# Patient Record
Sex: Female | Born: 1970 | Race: White | Hispanic: No | Marital: Married | Smoking: Never smoker
Health system: Southern US, Community
[De-identification: ages and names within clinical notes are randomized; demographics above are authoritative.]

## PROBLEM LIST (undated history)

## (undated) DIAGNOSIS — G4730 Sleep apnea, unspecified: Secondary | ICD-10-CM

## (undated) DIAGNOSIS — Z806 Family history of leukemia: Secondary | ICD-10-CM

## (undated) DIAGNOSIS — M6282 Rhabdomyolysis: Principal | ICD-10-CM

## (undated) DIAGNOSIS — E079 Disorder of thyroid, unspecified: Secondary | ICD-10-CM

## (undated) DIAGNOSIS — M1990 Unspecified osteoarthritis, unspecified site: Secondary | ICD-10-CM

## (undated) DIAGNOSIS — Z8249 Family history of ischemic heart disease and other diseases of the circulatory system: Secondary | ICD-10-CM

## (undated) DIAGNOSIS — T8859XA Other complications of anesthesia, initial encounter: Secondary | ICD-10-CM

## (undated) DIAGNOSIS — Z803 Family history of malignant neoplasm of breast: Secondary | ICD-10-CM

## (undated) DIAGNOSIS — N950 Postmenopausal bleeding: Secondary | ICD-10-CM

## (undated) DIAGNOSIS — T4145XA Adverse effect of unspecified anesthetic, initial encounter: Secondary | ICD-10-CM

## (undated) DIAGNOSIS — E2740 Unspecified adrenocortical insufficiency: Secondary | ICD-10-CM

## (undated) DIAGNOSIS — D759 Disease of blood and blood-forming organs, unspecified: Secondary | ICD-10-CM

## (undated) DIAGNOSIS — Z8042 Family history of malignant neoplasm of prostate: Secondary | ICD-10-CM

## (undated) HISTORY — DX: Family history of leukemia: Z80.6

## (undated) HISTORY — DX: Family history of malignant neoplasm of prostate: Z80.42

## (undated) HISTORY — PX: OTHER SURGICAL HISTORY: SHX169

## (undated) HISTORY — DX: Family history of malignant neoplasm of breast: Z80.3

---

## 2005-07-06 ENCOUNTER — Emergency Department (HOSPITAL_COMMUNITY): Admission: EM | Admit: 2005-07-06 | Discharge: 2005-07-06 | Payer: Self-pay | Admitting: Emergency Medicine

## 2005-07-16 ENCOUNTER — Encounter: Admission: RE | Admit: 2005-07-16 | Discharge: 2005-10-14 | Payer: Self-pay | Admitting: Orthopedic Surgery

## 2006-04-17 ENCOUNTER — Inpatient Hospital Stay (HOSPITAL_COMMUNITY): Admission: AD | Admit: 2006-04-17 | Discharge: 2006-04-18 | Payer: Self-pay | Admitting: Obstetrics and Gynecology

## 2006-06-02 ENCOUNTER — Inpatient Hospital Stay (HOSPITAL_COMMUNITY): Admission: AD | Admit: 2006-06-02 | Discharge: 2006-06-04 | Payer: Self-pay | Admitting: Obstetrics & Gynecology

## 2008-03-10 IMAGING — US US OB COMP +14 WK
1 series · 13 of 28 positions shown · non-contrast
Comparison: none

CLINICAL DATA: 35-year-old with 32 week gestation.  Contractions.  Pelvic pain.  Preterm labor.

[Series 1: us ob comp +14 wk · 0.35mm/px · 13 of 33 slices shown]
[im 2/33]
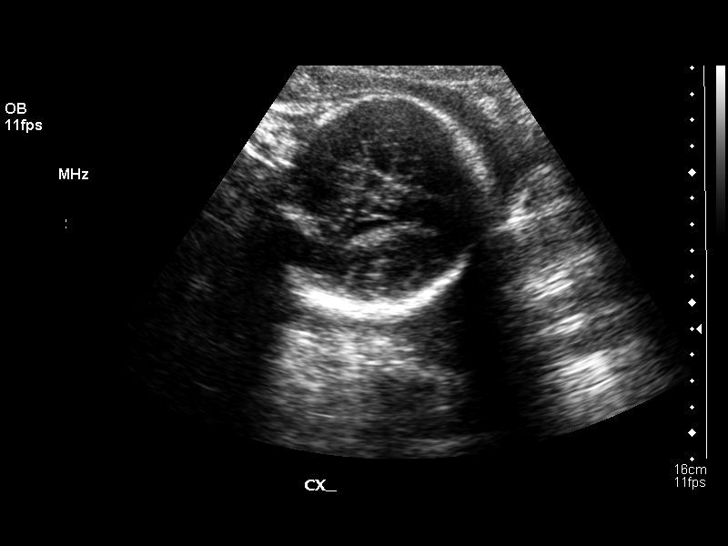
[im 4/33]
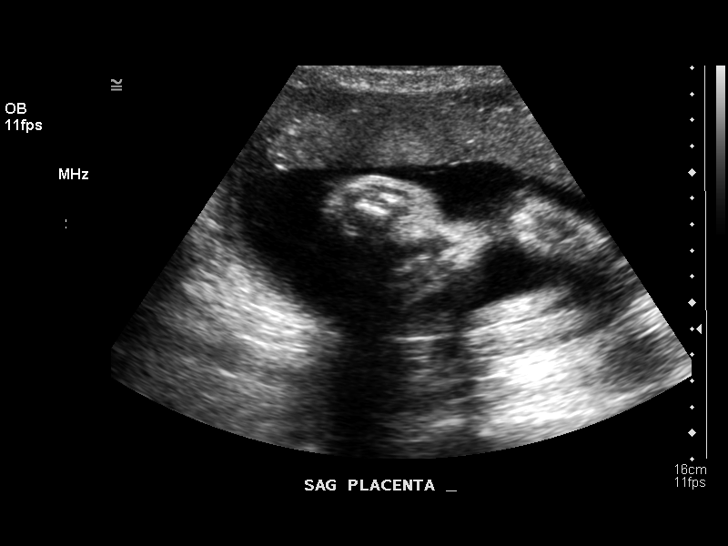
[im 6/33]
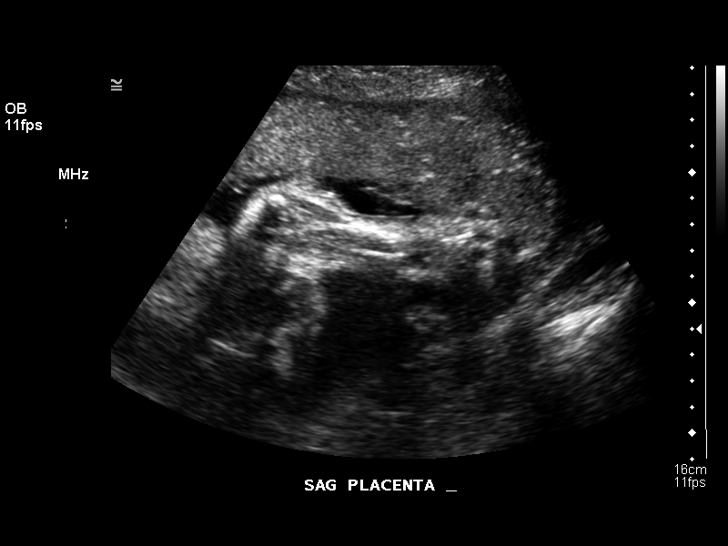
[im 9/33]
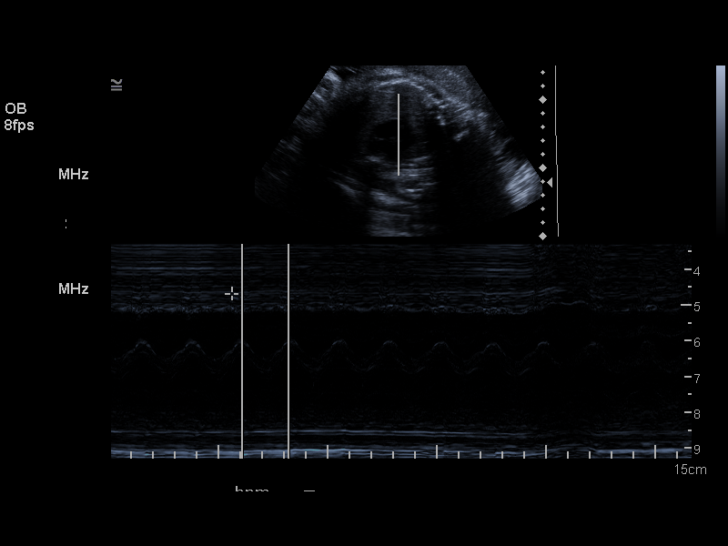
[im 11/33]
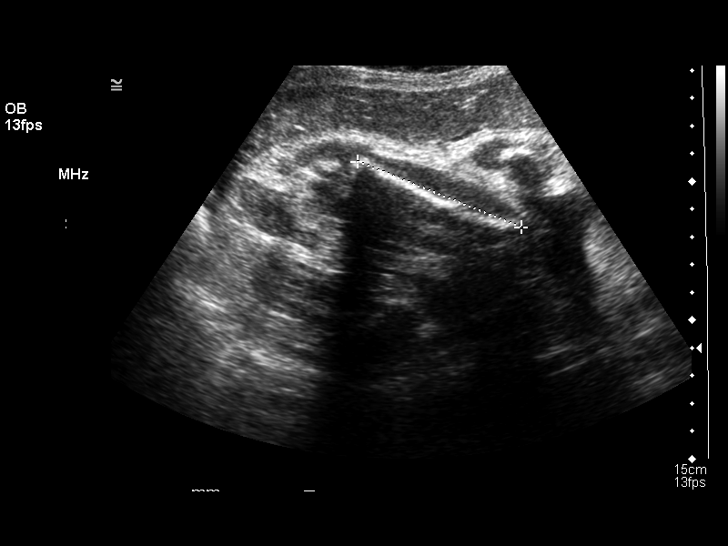
[im 14/33]
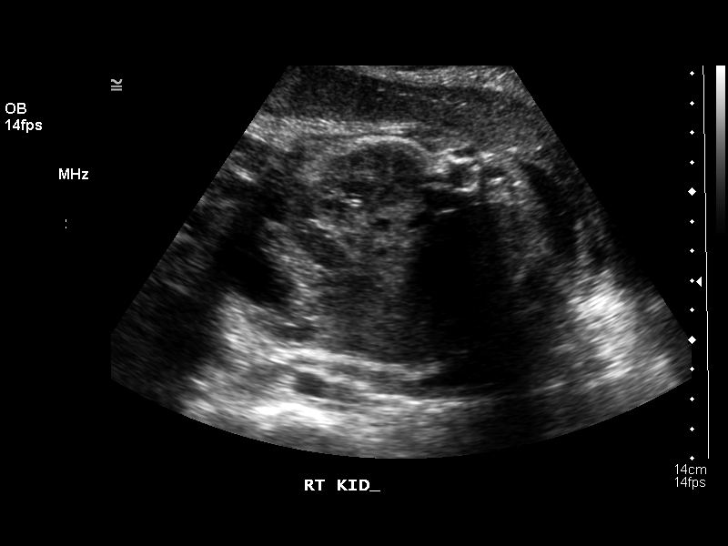
[im 17/33]
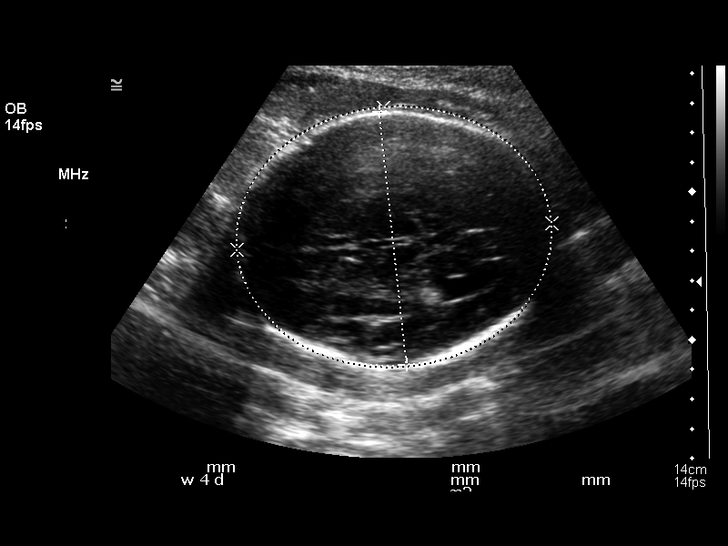
[im 19/33]
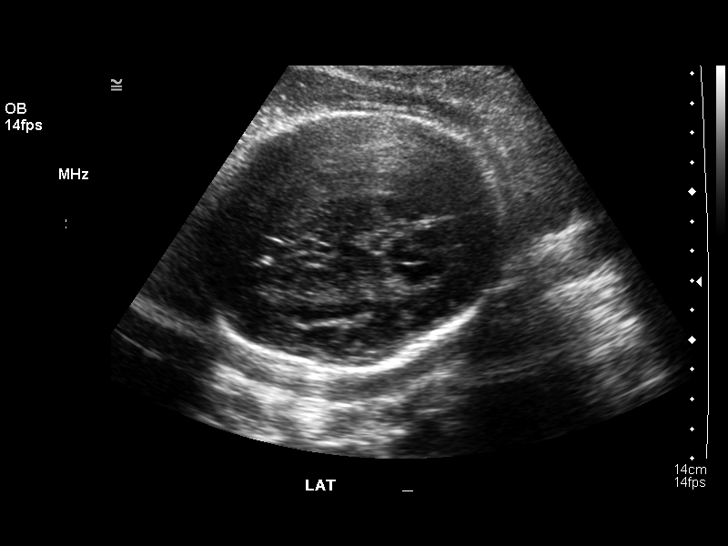
[im 22/33]
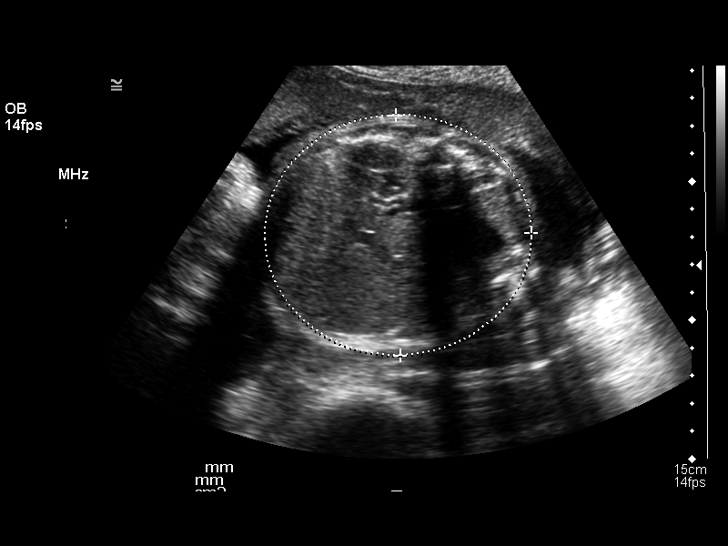
[im 24/33]
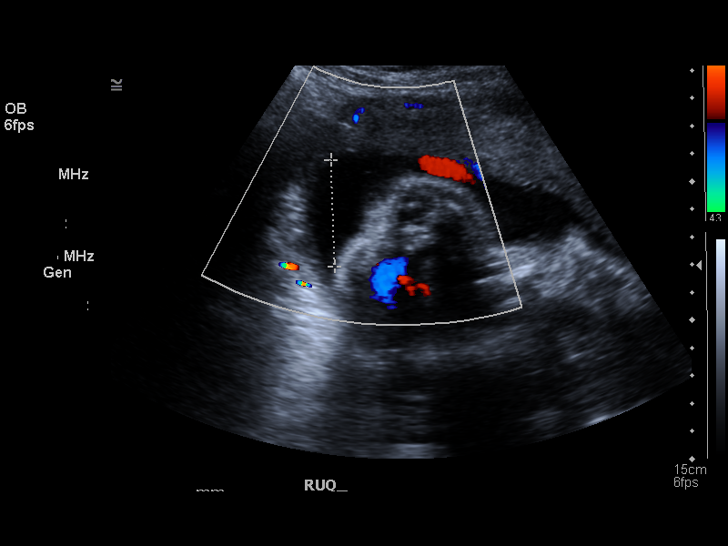
[im 27/33]
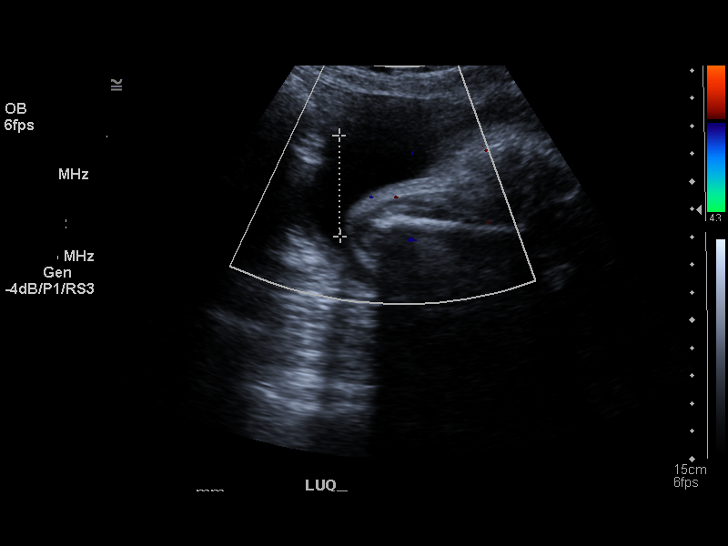
[im 29/33]
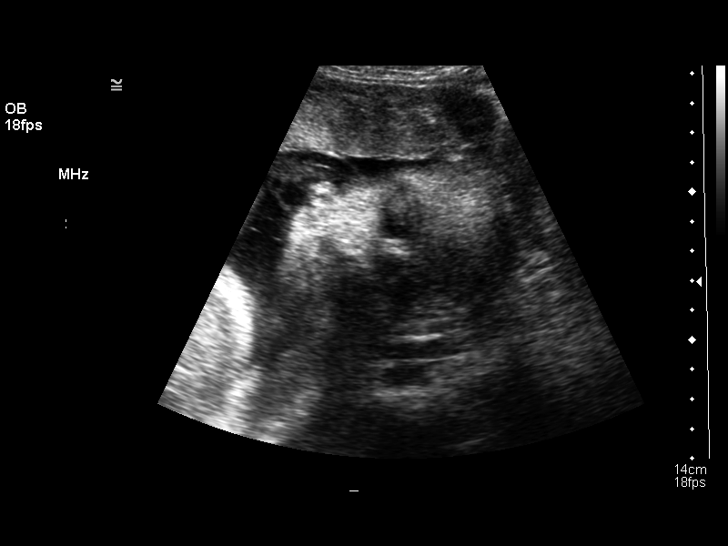
[im 31/33]
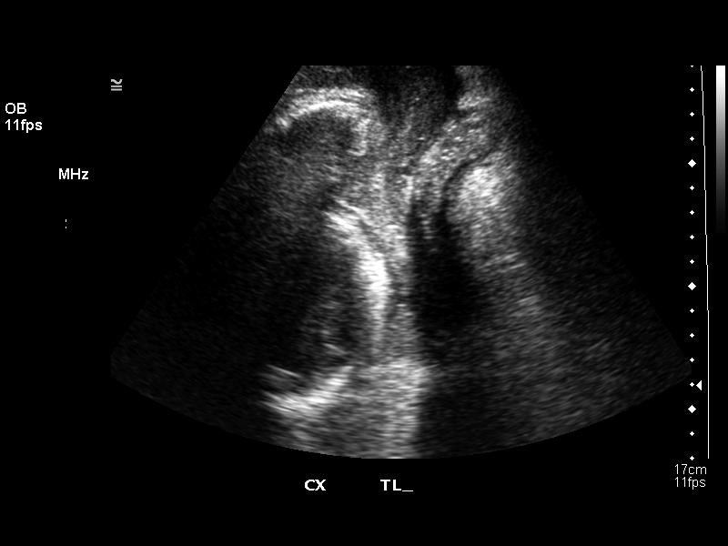

[13 of 28 positions shown; findings below may reference images not displayed]

OBSTETRICAL ULTRASOUND:
 Number of Fetuses: 1
 Heart Rate: 141
 Movement:  Yes
 Breathing:  Yes  
 Presentation:  Cephalic
 Placental Location:  Anterior
 Grade:  I
 Previa:  No
 Amniotic Fluid (Subjective):  Normal
 Amniotic Fluid (Objective): 14.7 cm AFI (5th -95th%ile = 8.3 ? 24.5 cm for 33 wks)

 FETAL BIOMETRY
 BPD:   8.5 cm   34 w 2 d
 HC:   30.4 cm  33 w 6 d
 AC:   28.6 cm  32 w 4 d
 FL:    6.4 cm  30 w 0 d

 MEAN GA:  33 w 3 d  US EDC:  06/02/06

 EFW:  4151 g (H) 50th ? 75th%ile (7978 ? 2775 g) For 33 wks

 FETAL ANATOMY
 Lateral Ventricles:    Visualized   
 Thalami/CSP:      Visualized 
 Posterior Fossa:  Not visualized 
 Nuchal Region:    Not visualized 
 Spine:      Not visualized 
 4 Chamber Heart on Left:      Not visualized 
 Stomach on Left:      Visualized 
 3 Vessel Cord:    Visualized 
 Cord Insertion site:    Not visualized 
 Kidneys:  Visualized 
 Bladder:  Visualized 
 Extremities:      Not visualized 

 ADDITIONAL ANATOMY VISUALIZED:  Upper lip and female genitalia.

 MATERNAL UTERINE AND ADNEXAL FINDINGS
 Cervix: 3.3 cm transabdominally
IMPRESSION: 1.  Single living intrauterine gestation in cephalic presentation with assigned gestational age of 32 weeks 4 days.  Estimated gestational age by this ultrasound is 33 weeks 3 days.  
 2.  Cervical length 3.3 cm.
 3.  Limited fetal anatomy due to advanced gestational age.

## 2008-12-13 ENCOUNTER — Encounter: Admission: RE | Admit: 2008-12-13 | Discharge: 2008-12-13 | Payer: Self-pay | Admitting: Obstetrics

## 2009-12-30 ENCOUNTER — Emergency Department (HOSPITAL_BASED_OUTPATIENT_CLINIC_OR_DEPARTMENT_OTHER): Admission: EM | Admit: 2009-12-30 | Discharge: 2009-12-30 | Payer: Self-pay | Admitting: Emergency Medicine

## 2010-12-12 LAB — RAPID STREP SCREEN (MED CTR MEBANE ONLY): Streptococcus, Group A Screen (Direct): POSITIVE — AB

## 2011-01-30 ENCOUNTER — Emergency Department (HOSPITAL_BASED_OUTPATIENT_CLINIC_OR_DEPARTMENT_OTHER)
Admission: EM | Admit: 2011-01-30 | Discharge: 2011-01-30 | Disposition: A | Discharge status: Home / Self Care | Payer: BC Managed Care – PPO | Attending: Emergency Medicine | Admitting: Emergency Medicine

## 2011-01-30 DIAGNOSIS — S30860A Insect bite (nonvenomous) of lower back and pelvis, initial encounter: Secondary | ICD-10-CM | POA: Insufficient documentation

## 2011-01-30 DIAGNOSIS — W57XXXA Bitten or stung by nonvenomous insect and other nonvenomous arthropods, initial encounter: Secondary | ICD-10-CM | POA: Insufficient documentation

## 2011-02-08 NOTE — Discharge Summary (Signed)
NAMEANARELY, NICHOLLS              ACCOUNT NO.:  [id number]   MEDICAL RECORD NO.:  [id number]          PATIENT TYPE:  INP   LOCATION:  9159                          FACILITY:  [facility]   PHYSICIAN:  Richardean Sale, M.D.   DATE OF BIRTH:  09-22-1971   DATE OF ADMISSION:  04/17/2006  DATE OF DISCHARGE:  04/18/2006                                 DISCHARGE SUMMARY   ADMITTING DIAGNOSIS:  32 week intrauterine pregnancy with preterm  contractions.   DISCHARGE DIAGNOSES:  32 week intrauterine pregnancy with preterm  contractions.   HOSPITAL COURSE/HISTORY OF PRESENT ILLNESS:  Please see admission history  and physical for details.  Briefly, this is a 40 year old gravida 2, para 1  white female who is at 62 and half weeks who presented to the office  complaining of some lower abdominal cramping.  The patient was found her on  cervical exam her cervix was dilated to 1 cm, 60% effaced, -2 station  vertex.  She was subsequently admitted for observation for rule out preterm  labor.  She received betamethasone x2 doses, started on Procardia for  preterm contractions and started on ampicillin while a group B strep culture  was pending.  The patient's cervix remained unchanged during her  hospitalization.  She underwent a fetal fibronectin on 04/18/2006 which was  negative and she was subsequently discharged to home with preterm labor  precautions.   DISPOSITION:  To home.  Condition stable.   FOLLOW UP:  The patient will follow up within one week in the office for  return OB visit.  She was given instructions to call for contractions  greater than six an hour, for any vaginal bleeding, loss of fluid, decreased  fetal movement.   LABORATORY STUDIES:  Hemoglobin 12.7, hematocrit 37.1, platelets 152.  Fetal  fibronectin negative.  Urinalysis negative.  Group B strep negative, RPR  nonreactive.      Richardean Sale, M.D.  Electronically Signed     JW/MEDQ  D:  07/07/2006  T:  07/08/2006   Job:  [id number]

## 2011-09-25 ENCOUNTER — Ambulatory Visit: Payer: BC Managed Care – PPO | Attending: Orthopedic Surgery | Admitting: Physical Therapy

## 2011-09-25 DIAGNOSIS — M25519 Pain in unspecified shoulder: Secondary | ICD-10-CM | POA: Insufficient documentation

## 2011-09-25 DIAGNOSIS — M25619 Stiffness of unspecified shoulder, not elsewhere classified: Secondary | ICD-10-CM | POA: Insufficient documentation

## 2011-10-01 ENCOUNTER — Ambulatory Visit: Payer: BC Managed Care – PPO | Admitting: Physical Therapy

## 2011-10-03 ENCOUNTER — Ambulatory Visit: Payer: BC Managed Care – PPO | Admitting: Physical Therapy

## 2011-10-08 ENCOUNTER — Ambulatory Visit: Payer: BC Managed Care – PPO | Admitting: Physical Therapy

## 2011-10-10 ENCOUNTER — Ambulatory Visit: Payer: BC Managed Care – PPO | Admitting: Physical Therapy

## 2013-06-20 ENCOUNTER — Emergency Department (HOSPITAL_BASED_OUTPATIENT_CLINIC_OR_DEPARTMENT_OTHER)
Admission: EM | Admit: 2013-06-20 | Discharge: 2013-06-20 | Disposition: A | Discharge status: Home / Self Care | Payer: BC Managed Care – PPO | Attending: Emergency Medicine | Admitting: Emergency Medicine

## 2013-06-20 ENCOUNTER — Encounter (HOSPITAL_BASED_OUTPATIENT_CLINIC_OR_DEPARTMENT_OTHER): Payer: Self-pay | Admitting: Emergency Medicine

## 2013-06-20 DIAGNOSIS — M79609 Pain in unspecified limb: Secondary | ICD-10-CM | POA: Insufficient documentation

## 2013-06-20 DIAGNOSIS — M6282 Rhabdomyolysis: Secondary | ICD-10-CM | POA: Insufficient documentation

## 2013-06-20 DIAGNOSIS — M79602 Pain in left arm: Secondary | ICD-10-CM

## 2013-06-20 LAB — CBC WITH DIFFERENTIAL/PLATELET
Basophils Absolute: 0 10*3/uL (ref 0.0–0.1)
Basophils Relative: 0 % (ref 0–1)
Eosinophils Relative: 3 % (ref 0–5)
HCT: 39.9 % (ref 36.0–46.0)
Hemoglobin: 13.9 g/dL (ref 12.0–15.0)
Lymphs Abs: 1.8 10*3/uL (ref 0.7–4.0)
MCHC: 34.8 g/dL (ref 30.0–36.0)
MCV: 93 fL (ref 78.0–100.0)
Monocytes Absolute: 0.7 10*3/uL (ref 0.1–1.0)
Neutro Abs: 4.1 10*3/uL (ref 1.7–7.7)
RBC: 4.29 MIL/uL (ref 3.87–5.11)
RDW: 12.1 % (ref 11.5–15.5)
WBC: 6.8 10*3/uL (ref 4.0–10.5)

## 2013-06-20 LAB — URINALYSIS, ROUTINE W REFLEX MICROSCOPIC
Glucose, UA: NEGATIVE mg/dL
Ketones, ur: NEGATIVE mg/dL

## 2013-06-20 LAB — BASIC METABOLIC PANEL
Calcium: 9.5 mg/dL (ref 8.4–10.5)
GFR calc non Af Amer: 69 mL/min — ABNORMAL LOW (ref >90)

## 2013-06-20 LAB — CK: Total CK: 2493 U/L — ABNORMAL HIGH (ref 7–177)

## 2013-06-20 LAB — URINE MICROSCOPIC-ADD ON

## 2013-06-20 MED ORDER — SODIUM CHLORIDE 0.9 % IV BOLUS (SEPSIS)
1000.0000 mL | Freq: Once | INTRAVENOUS | Status: AC
  Administered 2013-06-20: 1000 mL via INTRAVENOUS

## 2013-06-20 NOTE — ED Notes (Signed)
MD at bedside for disposition 

## 2013-06-20 NOTE — ED Provider Notes (Signed)
CSN: [id number]     Arrival date & time 06/20/13  0645 History   None    Chief Complaint  Patient presents with  . Arm Pain   (Consider location/radiation/quality/duration/timing/severity/associated sxs/prior Treatment) HPI Comments: Patient is a 42 year old female with no significant past medical history presents with complaints of left arm pain. She states that she was competing in a cross fit competition yesterday. When she was finished she developed significant soreness and swelling of the left tricep. She is concerned that she may be in rhabdomyolysis as others that have been training with her have had similar experiences. She denies any dark urine. She denies any other complaints.  Patient is a 42 y.o. female presenting with arm pain. The history is provided by the patient.  Arm Pain This is a new problem. The current episode started yesterday. The problem occurs constantly. The problem has been gradually worsening. Pertinent negatives include no chest pain and no shortness of breath. Exacerbated by: Movement and palpation. Nothing relieves the symptoms. She has tried nothing for the symptoms. The treatment provided no relief.    History reviewed. No pertinent past medical history. History reviewed. No pertinent past surgical history. History reviewed. No pertinent family history. History  Substance Use Topics  . Smoking status: Never Smoker   . Smokeless tobacco: Not on file  . Alcohol Use: Yes     Comment: social   OB History   Grav Para Term Preterm Abortions TAB SAB Ect Mult Living                 Review of Systems  Respiratory: Negative for shortness of breath.   Cardiovascular: Negative for chest pain.  All other systems reviewed and are negative.    Allergies  Review of patient's allergies indicates no known allergies.  Home Medications  No current outpatient prescriptions on file. BP 116/72  Pulse 78  Temp(Src) 97.8 F (36.6 C) (Oral)  Resp 16  Ht 5\' 8"   (1.727 m)  Wt 155 lb (70.308 kg)  BMI 23.57 kg/m2  SpO2 99%  LMP 05/27/2013 Physical Exam  Nursing note and vitals reviewed. Constitutional: She is oriented to person, place, and time. She appears well-developed and well-nourished. No distress.  HENT:  Head: Normocephalic and atraumatic.  Neck: Normal range of motion. Neck supple.  Cardiovascular: Normal rate and regular rhythm.  Exam reveals no gallop and no friction rub.   No murmur heard. Pulmonary/Chest: Effort normal and breath sounds normal. No respiratory distress. She has no wheezes.  Abdominal: Soft. Bowel sounds are normal. She exhibits no distension. There is no tenderness.  Musculoskeletal: Normal range of motion.  There is swelling and tenderness to palpation of the left tricep. There is no redness or inflammation. There is pain with range of motion. Distal ulnar and radial pulses are easily palpable.  Neurological: She is alert and oriented to person, place, and time.  Skin: Skin is warm and dry. She is not diaphoretic.    ED Course  Procedures (including critical care time) Labs Review Labs Reviewed  CBC WITH DIFFERENTIAL  BASIC METABOLIC PANEL  CK  URINALYSIS, ROUTINE W REFLEX MICROSCOPIC   Imaging Review No results found.  MDM  No diagnosis found. Patient presents here with complaints of arm pain and is concerned about rhabdomyolysis. Workup reveals a total CK of 2500. She was given 1 L of normal saline and the remainder of her laboratory studies are unremarkable. I discussed the case with Dr. Arlean Hopping from nephrology who did  not feel as though this level of CK put the patient in jeopardy of developing renal failure. His recommendations were increased fluids for the next several days and refrain from workouts for several days as well. She will be discharged home with these instructions and is to return if she develops new or other some symptoms.    Geoffery Lyons, MD 06/20/13 [phone]

## 2013-06-20 NOTE — ED Notes (Addendum)
Pt presents to Ed for evaluation of left arm pain and swelling. Pt reports competing in a cross fit competition yesterday, pt reports the pain and swelling began that night and is worried that it may be rhabdomyolysis. Pt reports 8/10 arm pain with movement and also nausea that started as well. Pt is alert and oriented. NAD noted at this time

## 2013-06-20 NOTE — ED Notes (Signed)
MD at bedside. 

## 2013-06-21 DIAGNOSIS — M6282 Rhabdomyolysis: Principal | ICD-10-CM

## 2013-06-21 HISTORY — DX: Rhabdomyolysis: M62.82

## 2013-06-22 ENCOUNTER — Encounter (HOSPITAL_BASED_OUTPATIENT_CLINIC_OR_DEPARTMENT_OTHER): Payer: Self-pay | Admitting: *Deleted

## 2013-06-22 ENCOUNTER — Inpatient Hospital Stay (HOSPITAL_BASED_OUTPATIENT_CLINIC_OR_DEPARTMENT_OTHER)
Admission: EM | Admit: 2013-06-22 | Discharge: 2013-06-25 | DRG: 560 | Disposition: A | Discharge status: Home / Self Care | Payer: BC Managed Care – PPO | Attending: Internal Medicine | Admitting: Internal Medicine

## 2013-06-22 DIAGNOSIS — R112 Nausea with vomiting, unspecified: Secondary | ICD-10-CM | POA: Diagnosis present

## 2013-06-22 DIAGNOSIS — E079 Disorder of thyroid, unspecified: Secondary | ICD-10-CM | POA: Diagnosis present

## 2013-06-22 DIAGNOSIS — N179 Acute kidney failure, unspecified: Secondary | ICD-10-CM

## 2013-06-22 DIAGNOSIS — R7989 Other specified abnormal findings of blood chemistry: Secondary | ICD-10-CM | POA: Diagnosis present

## 2013-06-22 DIAGNOSIS — M6282 Rhabdomyolysis: Principal | ICD-10-CM

## 2013-06-22 HISTORY — DX: Other complications of anesthesia, initial encounter: T88.59XA

## 2013-06-22 HISTORY — DX: Rhabdomyolysis: M62.82

## 2013-06-22 HISTORY — DX: Unspecified adrenocortical insufficiency: E27.40

## 2013-06-22 HISTORY — DX: Adverse effect of unspecified anesthetic, initial encounter: T41.45XA

## 2013-06-22 HISTORY — DX: Unspecified osteoarthritis, unspecified site: M19.90

## 2013-06-22 HISTORY — DX: Disorder of thyroid, unspecified: E07.9

## 2013-06-22 LAB — PHOSPHORUS
Phosphorus: 4.1 mg/dL (ref 2.3–4.6)
Phosphorus: 2.9 mg/dL (ref 2.3–4.6)

## 2013-06-22 LAB — BASIC METABOLIC PANEL
CO2: 27 mEq/L (ref 19–32)
Calcium: 9.4 mg/dL (ref 8.4–10.5)
GFR calc non Af Amer: 61 mL/min — ABNORMAL LOW (ref >90)
Glucose, Bld: 71 mg/dL (ref 70–99)
Sodium: 138 mEq/L (ref 135–145)

## 2013-06-22 LAB — CBC WITH DIFFERENTIAL/PLATELET
Basophils Relative: 0 % (ref 0–1)
Eosinophils Relative: 2 % (ref 0–5)
HCT: 37.4 % (ref 36.0–46.0)
Lymphocytes Relative: 26 % (ref 12–46)
Lymphs Abs: 1.7 10*3/uL (ref 0.7–4.0)
MCV: 92.8 fL (ref 78.0–100.0)
Monocytes Absolute: 0.5 10*3/uL (ref 0.1–1.0)
Neutro Abs: 4.3 10*3/uL (ref 1.7–7.7)
RBC: 4.03 MIL/uL (ref 3.87–5.11)
WBC: 6.6 10*3/uL (ref 4.0–10.5)

## 2013-06-22 LAB — COMPREHENSIVE METABOLIC PANEL
ALT: 59 U/L — ABNORMAL HIGH (ref 0–35)
AST: 160 U/L — ABNORMAL HIGH (ref 0–37)
Albumin: 3.1 g/dL — ABNORMAL LOW (ref 3.5–5.2)
Alkaline Phosphatase: 31 U/L — ABNORMAL LOW (ref 39–117)
Calcium: 8.4 mg/dL (ref 8.4–10.5)
Chloride: 105 mEq/L (ref 96–112)
GFR calc non Af Amer: 48 mL/min — ABNORMAL LOW (ref >90)
Glucose, Bld: 97 mg/dL (ref 70–99)
Potassium: 4.1 mEq/L (ref 3.5–5.1)
Sodium: 139 mEq/L (ref 135–145)
Total Protein: 6 g/dL (ref 6.0–8.3)

## 2013-06-22 LAB — URINE MICROSCOPIC-ADD ON

## 2013-06-22 LAB — URINALYSIS, ROUTINE W REFLEX MICROSCOPIC
Bilirubin Urine: NEGATIVE
Leukocytes, UA: NEGATIVE
Nitrite: NEGATIVE
Protein, ur: NEGATIVE mg/dL
Specific Gravity, Urine: 1.012 (ref 1.005–1.030)

## 2013-06-22 LAB — CK TOTAL AND CKMB (NOT AT ARMC)
CK, MB: 7.1 ng/mL (ref 0.3–4.0)
CK, MB: 9.5 ng/mL (ref 0.3–4.0)
Relative Index: 0.1 (ref 0.0–2.5)
Relative Index: 0.1 (ref 0.0–2.5)
Relative Index: 0.1 (ref 0.0–2.5)
Total CK: 9289 U/L — ABNORMAL HIGH (ref 7–177)

## 2013-06-22 LAB — POTASSIUM
Potassium: 3.7 mEq/L (ref 3.5–5.1)
Potassium: 3.6 mEq/L (ref 3.5–5.1)

## 2013-06-22 LAB — CREATININE, SERUM: GFR calc non Af Amer: 66 mL/min — ABNORMAL LOW (ref >90)

## 2013-06-22 MED ORDER — OXYCODONE HCL 5 MG PO TABS
5.0000 mg | ORAL_TABLET | ORAL | Status: DC | PRN
  Administered 2013-06-22 – 2013-06-23 (×3): 5 mg via ORAL
  Filled 2013-06-22 (×3): qty 1

## 2013-06-22 MED ORDER — ONDANSETRON HCL 4 MG/2ML IJ SOLN
4.0000 mg | Freq: Four times a day (QID) | INTRAMUSCULAR | Status: DC | PRN
  Administered 2013-06-22 – 2013-06-23 (×2): 4 mg via INTRAVENOUS
  Filled 2013-06-22 (×2): qty 2

## 2013-06-22 MED ORDER — SODIUM CHLORIDE 0.9 % IV SOLN
INTRAVENOUS | Status: DC
  Administered 2013-06-22 (×2): via INTRAVENOUS

## 2013-06-22 MED ORDER — SODIUM CHLORIDE 0.45 % IV SOLN: INTRAVENOUS | Status: AC

## 2013-06-22 MED ORDER — ZOLPIDEM TARTRATE 5 MG PO TABS
5.0000 mg | ORAL_TABLET | Freq: Every evening | ORAL | Status: DC | PRN
  Administered 2013-06-23 – 2013-06-24 (×2): 5 mg via ORAL
  Filled 2013-06-22 (×2): qty 1

## 2013-06-22 MED ORDER — SODIUM BICARBONATE 8.4 % IV SOLN
INTRAVENOUS | Status: AC
  Administered 2013-06-22: 50 meq via INTRAVENOUS
  Filled 2013-06-22: qty 50

## 2013-06-22 MED ORDER — SODIUM CHLORIDE 0.9 % IV BOLUS (SEPSIS)
1000.0000 mL | Freq: Once | INTRAVENOUS | Status: AC
  Administered 2013-06-22: 18:00:00 1000 mL via INTRAVENOUS

## 2013-06-22 MED ORDER — ASPIRIN EC 81 MG PO TBEC
81.0000 mg | DELAYED_RELEASE_TABLET | Freq: Every day | ORAL | Status: DC
  Administered 2013-06-22 – 2013-06-23 (×2): 81 mg via ORAL
  Filled 2013-06-22 (×4): qty 1

## 2013-06-22 MED ORDER — SENNA 8.6 MG PO TABS
1.0000 | ORAL_TABLET | Freq: Every day | ORAL | Status: DC | PRN
  Filled 2013-06-22: qty 1

## 2013-06-22 MED ORDER — SODIUM CHLORIDE 0.9 % IV SOLN
Freq: Once | INTRAVENOUS | Status: AC
  Administered 2013-06-22: 06:00:00 via INTRAVENOUS

## 2013-06-22 MED ORDER — MORPHINE SULFATE 2 MG/ML IJ SOLN: 1.0000 mg | INTRAMUSCULAR | Status: DC | PRN

## 2013-06-22 MED ORDER — SODIUM BICARBONATE 8.4 % IV SOLN
50.0000 meq | Freq: Once | INTRAVENOUS | Status: AC
  Administered 2013-06-22: 50 meq via INTRAVENOUS

## 2013-06-22 MED ORDER — SODIUM CHLORIDE 0.9 % IV SOLN
INTRAVENOUS | Status: DC
  Administered 2013-06-22 – 2013-06-24 (×15): via INTRAVENOUS
  Administered 2013-06-24 (×2): 1000 mL via INTRAVENOUS

## 2013-06-22 MED ORDER — ENOXAPARIN SODIUM 40 MG/0.4ML ~~LOC~~ SOLN
40.0000 mg | SUBCUTANEOUS | Status: DC
  Administered 2013-06-22: 40 mg via SUBCUTANEOUS
  Filled 2013-06-22 (×4): qty 0.4

## 2013-06-22 MED ORDER — DOCUSATE SODIUM 100 MG PO CAPS
100.0000 mg | ORAL_CAPSULE | Freq: Two times a day (BID) | ORAL | Status: DC | PRN
  Administered 2013-06-22: 100 mg via ORAL
  Filled 2013-06-22: qty 1

## 2013-06-22 MED ORDER — ONDANSETRON HCL 4 MG PO TABS: 4.0000 mg | ORAL_TABLET | Freq: Four times a day (QID) | ORAL | Status: DC | PRN

## 2013-06-22 NOTE — Progress Notes (Signed)
Utilization review completed. Calem Cocozza RN CCM Case Mgmt  

## 2013-06-22 NOTE — Progress Notes (Signed)
PENDING ACCEPTANCE TRANFER NOTE:  Call received from: Dr Judd Lien  REASON FOR REQUESTING TRANSFER: to treat for rhabdo.  HPI: 42 yo doing crossfit, returned to [facility] with swollen arms and CPK rised from 2500 to 7500 now with Cr of 1.1. She was seen on Sunday with one arm swelling, and now has both with rising CPK.  Dr Judd Lien was concerned because of the increasing in CPK, despite relatively low in total CPK.  He ponders about bringing her in the hospital for IVF and to follow CPK level making sure it is not continuing to rise.   PLAN:  According to telephone report, this patient was accepted for transfer to medical bed,   Under [facility] team: 10,  I have requested an order be written to call Flow Manager at [phone] upon patient arrival to the floor for final physician assignment who will do the admission and give admitting orders.  SIGNEDHouston Siren, MD [facility]  06/22/2013, 6:46 AM

## 2013-06-22 NOTE — ED Provider Notes (Signed)
CSN: [id number]     Arrival date & time 06/22/13  0434 History   First MD Initiated Contact with Patient 06/22/13 0449     No chief complaint on file.  (Consider location/radiation/quality/duration/timing/severity/associated sxs/prior Treatment) HPI Comments: Patient is a 42 year old female presents with complaints of right arm swelling. She competed in a cross fit competition on Saturday. She was seen here on Sunday and had elevated CK level. She was hydrated and discharged to home. Since she's been home she is now noticing swelling and discomfort in the left arm similar to what she had with the right. She denies any dark urine. She denies any fevers or chills. She denies any new injury or trauma. She reports that she has been following her discharge instructions and increase her fluid intake.  Patient is a 42 y.o. female presenting with arm injury. The history is provided by the patient.  Arm Injury Upper extremity pain location: Tricep. Time since incident:  3 days Injury: yes   Mechanism of injury comment:  Cross fit competition Pain details:    Quality:  Aching (Swelling)   Radiates to:  Does not radiate   Severity:  Moderate   Onset quality:  Gradual   Duration:  3 days   Timing:  Constant Chronicity:  New Dislocation: no     History reviewed. No pertinent past medical history. History reviewed. No pertinent past surgical history. History reviewed. No pertinent family history. History  Substance Use Topics  . Smoking status: Never Smoker   . Smokeless tobacco: Not on file  . Alcohol Use: Yes     Comment: social   OB History   Grav Para Term Preterm Abortions TAB SAB Ect Mult Living                 Review of Systems  All other systems reviewed and are negative.    Allergies  Review of patient's allergies indicates no known allergies.  Home Medications  No current outpatient prescriptions on file. BP 111/68  Pulse 60  Temp(Src) 98.3 F (36.8 C) (Oral)  Resp  16  Ht 5\' 8"  (1.727 m)  Wt 155 lb (70.308 kg)  BMI 23.57 kg/m2  SpO2 100%  LMP 05/27/2013 Physical Exam  Nursing note and vitals reviewed. Constitutional: She is oriented to person, place, and time. She appears well-developed and well-nourished. No distress.  HENT:  Head: Normocephalic and atraumatic.  Mouth/Throat: Oropharynx is clear and moist.  Neck: Normal range of motion. Neck supple.  Musculoskeletal: Normal range of motion. She exhibits no edema.  There is swelling and tenderness to palpation of the right upper arm especially in the area of the triceps. There is pain in the muscles when moving the elbow. Distal ulnar and radial pulses are easily palpable and motor and sensory are intact to the bilateral hands.  Neurological: She is alert and oriented to person, place, and time.  Skin: Skin is warm and dry. She is not diaphoretic.    ED Course  Procedures (including critical care time) Labs Review Labs Reviewed  CK  BASIC METABOLIC PANEL  URINALYSIS, ROUTINE W REFLEX MICROSCOPIC   Imaging Review No results found.  MDM  No diagnosis found. Patient was seen here 2 days ago by myself for swelling in the left arm. This was determined to be mild rhabdomyolysis with a CK of 2500.  I consulted with Dr. Arta Silence from nephrology who did not feel as though this level did not pose the danger of renal failure. She  was given normal saline and told to increase her fluids and discharged to home.  She returns today with the same condition in her right arm. Repeat CK level today is now over 7500. I have discussed the case with Dr. Conley Rolls from internal medicine who agrees to accept patient in transfer. She will be started on half normal saline with an amp of bicarbonate and admitted at [facility] for observation. The ulnar and radial pulses in both upper extremities are easily palpable and there is no numbness or tingling. I have little suspicion for compartment syndrome at this point, however I feel as  though observation is indicated in case her condition progresses to this point.    Geoffery Lyons, MD 06/22/13 [phone]

## 2013-06-22 NOTE — ED Notes (Signed)
MD at bedside. 

## 2013-06-22 NOTE — H&P (Addendum)
[facility] History and Physical  Carol Rodriguez WUJ:[id number] DOB: [birth year] DOA: 06/22/2013  Referring physician: PCP: Allean Found, MD  Specialists:   Chief Complaint: Bilateral Bicep pain  HPI: Carol Rodriguez 42 y.o. yo  WF PMHx  none . 43 yo doing crossfit, presented to [facility] with swollen arms and CPK rised from 2500 to 7500 now with Cr of 1.1.  She was seen on Sunday with one arm swelling, and now has both with rising CPK. Dr Judd Lien was concerned because of the increasing in CPK, despite relatively low in total CPK. He ponders about bringing her in the hospital for IVF and to follow CPK level making sure it is not continuing to rise. Patient states that she drinks protein shakes after every workout session (3 time per day), also takes multiple supplements to aid in her crossfit workouts.   Review of Systems: The patient denies anorexia, fever, weight loss,, vision loss, decreased hearing, hoarseness, chest pain, syncope, dyspnea on exertion, peripheral edema, balance deficits, hemoptysis, abdominal pain, melena, hematochezia, severe indigestion/heartburn, hematuria, incontinence, genital sores, muscle weakness, suspicious skin lesions, transient blindness, difficulty walking, depression, unusual weight change, abnormal bleeding, enlarged lymph nodes, angioedema, and breast masses.   Procedure    Past Medical History  Diagnosis Date  . Rhabdomyolysis 06/21/2013  . Complication of anesthesia     HAS NEVER HAD ANESTHESIA  . Thyroid disorder   . Arthritis   . Adrenal cortical insufficiency    History reviewed. No pertinent past surgical history. Social History:  reports that she has never smoked. She has never used smokeless tobacco. She reports that  drinks alcohol. She reports that she does not use illicit drugs.   Allergies  Allergen Reactions  . Eggs Or Egg-Derived Products Other (See Comments)    Egg whites-     History reviewed. No pertinent family history.    Prior to Admission medications   Not on File   Physical Exam: Filed Vitals:   06/22/13 0442 06/22/13 0744 06/22/13 0918 06/22/13 1459  BP: 111/68 113/74 125/78 109/70  Pulse: 60  63 59  Temp: 98.3 F (36.8 C) 98.1 F (36.7 C) 98.2 F (36.8 C) 98.5 F (36.9 C)  TempSrc: Oral Oral Oral Oral  Resp: 16 18 18 18   Height: 5\' 8"  (1.727 m)  5\' 8"  (1.727 m)   Weight: 70.308 kg (155 lb)  74.9 kg (165 lb 2 oz)   SpO2: 100% 100% 100% 98%     General:  A./O. X4, NAD  Eyes: He was equal reactive to light and accommodation  Neck: Negative JVD  Cardiovascular: Regular rhythm and rate, negative murmurs rubs or gallops, DP/PT pulse 2+ bilateral  Respiratory: Clear to auscultation bilateral  Abdomen: Soft, nontender, nondistended plus bowel sounds  Musculoskeletal: Bilateral bicep/tricep pain on palpation  Neurologic: Pupils equal round reactive to light and accommodation, all extremities strength 5/5, cranial nerves II through XII intact, sensation intact throughout, tongue/uvula midline  Labs on Admission:  Basic Metabolic Panel:  Recent Labs Lab 06/20/13 0721 06/22/13 0520 06/22/13 1229 06/22/13 1441  NA 138 138 139  --   K 4.7 3.9 4.1 3.7  CL 103 103 105  --   CO2 28 27 27   --   GLUCOSE 101* 71 97  --   BUN 19 20 16   --   CREATININE 1.00 1.10 1.35*  --   CALCIUM 9.5 9.4 8.4  --   MG  --   --  1.9  --   PHOS  --   --  4.1 4.1   Liver Function Tests:  Recent Labs Lab 06/22/13 1229  AST 160*  ALT 59*  ALKPHOS 31*  BILITOT 0.5  PROT 6.0  ALBUMIN 3.1*   No results found for this basename: LIPASE, AMYLASE,  in the last 168 hours No results found for this basename: AMMONIA,  in the last 168 hours CBC:  Recent Labs Lab 06/20/13 0721 06/22/13 1229  WBC 6.8 6.6  NEUTROABS 4.1 4.3  HGB 13.9 12.7  HCT 39.9 37.4  MCV 93.0 92.8  PLT 180 172   Cardiac Enzymes:  Recent Labs Lab 06/20/13 0721 06/22/13 0520 06/22/13 1229 06/22/13 1442  CKTOTAL 2493*  7588* 9289* 10916*  CKMB  --   --  7.1* 7.9*    BNP (last 3 results) No results found for this basename: PROBNP,  in the last 8760 hours CBG: No results found for this basename: GLUCAP,  in the last 168 hours  Radiological Exams on Admission: No results found.  EKG: Pending  Assessment/Plan Principal Problem:   Rhabdomyolysis Active Problems:   Acute kidney injury   1. Rhabdomyolysis; patient's CK continued to climb bolus 1 L normal saline then run normal saline at 1 L per hour -Every 3 hour CK, potassium, phosphorus and creatinine checks -If precipitous rise in potassium/phosphorus will need to obtain ABG to determine as a base deficit  2. AK I. see #1     Code Status: Full Disposition Plan:   Time spent: 60 minutes  Sukhman Kocher, Roselind Messier [facility] Pager [phone]  If 7PM-7AM, please contact night-coverage [email or web address] Password [email or web address] 06/22/2013, 7:22 PM

## 2013-06-22 NOTE — ED Notes (Signed)
Pt was seen here on Saturday night for left arm swelling and dx'd with rhabdo. Pt now here with right arm swelling. Pt denies any further lifting or injury.

## 2013-06-23 LAB — CREATININE, SERUM
Creatinine, Ser: 0.82 mg/dL (ref 0.50–1.10)
GFR calc non Af Amer: 88 mL/min — ABNORMAL LOW (ref >90)

## 2013-06-23 LAB — PHOSPHORUS
Phosphorus: 2.8 mg/dL (ref 2.3–4.6)
Phosphorus: 2 mg/dL — ABNORMAL LOW (ref 2.3–4.6)
Phosphorus: 2.5 mg/dL (ref 2.3–4.6)

## 2013-06-23 LAB — CK TOTAL AND CKMB (NOT AT ARMC)
CK, MB: 9.2 ng/mL (ref 0.3–4.0)
CK, MB: 8.6 ng/mL (ref 0.3–4.0)
CK, MB: 9.9 ng/mL (ref 0.3–4.0)
CK, MB: 10.9 ng/mL (ref 0.3–4.0)
Relative Index: 0.1 (ref 0.0–2.5)
Relative Index: 0.1 (ref 0.0–2.5)
Relative Index: 0.1 (ref 0.0–2.5)
Total CK: 12472 U/L — ABNORMAL HIGH (ref 7–177)
Total CK: 11829 U/L — ABNORMAL HIGH (ref 7–177)
Total CK: 14890 U/L — ABNORMAL HIGH (ref 7–177)
Total CK: 16003 U/L — ABNORMAL HIGH (ref 7–177)

## 2013-06-23 LAB — COMPREHENSIVE METABOLIC PANEL
ALT: 76 U/L — ABNORMAL HIGH (ref 0–35)
AST: 211 U/L — ABNORMAL HIGH (ref 0–37)
Albumin: 2.8 g/dL — ABNORMAL LOW (ref 3.5–5.2)
CO2: 20 mEq/L (ref 19–32)
Calcium: 6.8 mg/dL — ABNORMAL LOW (ref 8.4–10.5)
Chloride: 112 mEq/L (ref 96–112)
Creatinine, Ser: 0.85 mg/dL (ref 0.50–1.10)
Glucose, Bld: 93 mg/dL (ref 70–99)
Potassium: 3.9 mEq/L (ref 3.5–5.1)
Sodium: 141 mEq/L (ref 135–145)
Total Bilirubin: 0.7 mg/dL (ref 0.3–1.2)

## 2013-06-23 LAB — POTASSIUM: Potassium: 3.3 mEq/L — ABNORMAL LOW (ref 3.5–5.1)

## 2013-06-23 MED ORDER — WHITE PETROLATUM GEL
Status: AC
  Filled 2013-06-23: qty 5

## 2013-06-23 MED ORDER — POTASSIUM CHLORIDE CRYS ER 20 MEQ PO TBCR
60.0000 meq | EXTENDED_RELEASE_TABLET | Freq: Once | ORAL | Status: AC
  Administered 2013-06-23: 60 meq via ORAL
  Filled 2013-06-23: qty 3

## 2013-06-23 MED ORDER — PROMETHAZINE HCL 25 MG/ML IJ SOLN
12.5000 mg | Freq: Four times a day (QID) | INTRAMUSCULAR | Status: DC | PRN
  Administered 2013-06-23: 10:00:00 12.5 mg via INTRAVENOUS
  Filled 2013-06-23: qty 1

## 2013-06-23 NOTE — Progress Notes (Signed)
Pt instructed to void in measuring hat placed in [facility]. Pt has agreed.

## 2013-06-23 NOTE — Progress Notes (Signed)
CRITICAL VALUE ALERT  Critical value received:  CK-MB 9.2  Date of notification:  06/23/2013  Time of notification:  0221  Critical value read back:yes  Nurse who received alert:  Marlon Pel  MD notified (1st page):  Craige Cotta NP  Time of first page:  0228  MD notified (2nd page):  Time of second page:  Responding MD:  Craige Cotta NP  Time MD responded:

## 2013-06-23 NOTE — Progress Notes (Signed)
[facility] PROGRESS NOTE  Meggin Ola WUJ:[id number] DOB: [birth year] DOA: 06/22/2013 PCP: Allean Found, MD  HPI/Subjective: 42 yo WF with no pertinent PMH presented to [facility] with bilateral swollen arms, CPK 7500, and Cr 1.1.  She was doing crossfit workouts 3x/day and drinking protein shakes after each workout in preparation for a crossfit competition which she competed in on Saturday 06/19/13.  Sunday she presented to the ED with one swollen arm and CK 2500.  She was hydrated and D/C home.  She returned yesterday 06/22/13 with both arms swollen and an increase in CPK, CK-MB, and Cr.    She is still in much pain and hesitant to move arms due to discomfort.  She has nausea and vomiting, with last episode being around 0715.  It was watery in consistency, no food product.  She also reports inability to keep her legs still.  Denies fever, chills, chest pain, SOB, abd pain, changes in bowel habits, or urinary changes.  Assessment/Plan:  Rhabdomyolysis: - Likely secondary to strenuous workouts and increased intake of protein - Cr was 1.1 on admission; currently 0.85 (06/23/13) - IVF with bicarb bolus - Will continue to monitor, monitor Intake and output closely  AKI: - Cr was 1.1 on admission, currently 0.85 (10/1) - Continue IVF - Will continue to monitor  Nausea and Vomiting: - unknown etiology currently; may be secondary to pain - Last episode vomiting was 10/1 0715 - Was given Zofran, but denies effectiveness - May try phenergan suppository if continues to have - Will continue to monitor  Code Status: Full Family Communication: Husband at bedside  Disposition Plan: Remain inpatient   Consultants:  None  Procedures:  None  Antibiotics:  None      Objective: Filed Vitals:   06/23/13 0500  BP: 111/74  Pulse: 67  Temp: 98.3 F (36.8 C)  Resp: 18    Intake/Output Summary (Last 24 hours) at 06/23/13 0824 Last data filed at 06/23/13 0723  Gross per 24  hour  Intake 8693.33 ml  Output    300 ml  Net 8393.33 ml   Filed Weights   06/22/13 0442 06/22/13 0918 06/23/13 0500  Weight: 70.308 kg (155 lb) 74.9 kg (165 lb 2 oz) 80.2 kg (176 lb 12.9 oz)    Exam:   General:  Seeming uncomfortable and stiff resting in bed, but in no apparent distress  Cardiovascular: RRR, no m/g/r  Respiratory: Clear to auscultation bilaterally.  No rales, rhonchi, or wheezes  Abdomen: Soft, non-tender.  Bowel sounds heard in all 4 quadrants  Musculoskeletal: Limited ROM in upper extremities bilaterally.  4/5 Strength upper, 5/5 strength lower.  Swelling present in upper extremities bilaterally to just distal to the elbow.  Distal pulses intact and symmetrical.  Normal sensations B/L upper and lower extremities.  No peripheral edema in distal upper extremities or lower extremities.   Data Reviewed: Basic Metabolic Panel:  Recent Labs Lab 06/20/13 0721 06/22/13 0520 06/22/13 1229 06/22/13 1441 06/22/13 2019 06/23/13 0042 06/23/13 0345  NA 138 138 139  --   --  141  --   K 4.7 3.9 4.1 3.7 3.6 3.9 3.6  CL 103 103 105  --   --  112  --   CO2 28 27 27   --   --  20  --   GLUCOSE 101* 71 97  --   --  93  --   BUN 19 20 16   --   --  11  --   CREATININE  1.00 1.10 1.35*  --  1.04 0.85 0.82  CALCIUM 9.5 9.4 8.4  --   --  6.8*  --   MG  --   --  1.9  --   --   --   --   PHOS  --   --  4.1 4.1 2.9 3.1 2.8   Liver Function Tests:  Recent Labs Lab 06/22/13 1229 06/23/13 0042  AST 160* 211*  ALT 59* 76*  ALKPHOS 31* 28*  BILITOT 0.5 0.7  PROT 6.0 5.6*  ALBUMIN 3.1* 2.8*   CBC:  Recent Labs Lab 06/20/13 0721 06/22/13 1229  WBC 6.8 6.6  NEUTROABS 4.1 4.3  HGB 13.9 12.7  HCT 39.9 37.4  MCV 93.0 92.8  PLT 180 172   Cardiac Enzymes:  Recent Labs Lab 06/22/13 1229 06/22/13 1442 06/22/13 2019 06/23/13 0042 06/23/13 0345  CKTOTAL 9289* 10916* 13314* 12472* 11829*  CKMB 7.1* 7.9* 9.5* 9.2* 8.6*    Scheduled Meds: . sodium chloride    Intravenous STAT  . aspirin EC  81 mg Oral Daily  . enoxaparin (LOVENOX) injection  40 mg Subcutaneous Q24H  . white petrolatum       Continuous Infusions: . sodium chloride 200 mL/hr at 06/22/13 1641  . sodium chloride 1,000 mL/hr at 06/23/13 1610    Principal Problem:   Rhabdomyolysis Active Problems:   Acute kidney injury   Joycelyn Man, PA-S  [facility] Pager [phone]. If 7PM-7AM, please contact night-coverage at [email or web address], password [facility] 06/23/2013, 8:24 AM  LOS: 1 day    Addendum  Patient seen and examined, chart and data base reviewed.  I agree with the above assessment and plan.  For full details please see Mrs. Joycelyn Man, PA-S note.  The above note is reviewed and addended by me.   Clint Lipps, MD [facility] Pager: [phone] 06/23/2013, 2:02 PM

## 2013-06-23 NOTE — Progress Notes (Signed)
CRITICAL VALUE ALERT  Critical value received:  CK-MB 8.6  Date of notification:  06/23/2013  Time of notification:  0345  Critical value read back:yes  Nurse who received alert:  Marlon Pel  MD notified (1st page):  Craige Cotta NP  Time of first page:    MD notified (2nd page):  Time of second page:  Responding MD:  Craige Cotta NP  Time MD responded:

## 2013-06-24 LAB — COMPREHENSIVE METABOLIC PANEL
ALT: 105 U/L — ABNORMAL HIGH (ref 0–35)
Alkaline Phosphatase: 27 U/L — ABNORMAL LOW (ref 39–117)
CO2: 23 mEq/L (ref 19–32)
Chloride: 113 mEq/L — ABNORMAL HIGH (ref 96–112)
GFR calc Af Amer: >90 mL/min (ref >90)
GFR calc non Af Amer: 85 mL/min — ABNORMAL LOW (ref >90)
Glucose, Bld: 90 mg/dL (ref 70–99)
Potassium: 3.6 mEq/L (ref 3.5–5.1)
Sodium: 144 mEq/L (ref 135–145)
Total Bilirubin: 0.8 mg/dL (ref 0.3–1.2)

## 2013-06-24 LAB — CK TOTAL AND CKMB (NOT AT ARMC)
CK, MB: 7.7 ng/mL (ref 0.3–4.0)
Relative Index: 0.1 (ref 0.0–2.5)
Total CK: 13073 U/L — ABNORMAL HIGH (ref 7–177)

## 2013-06-24 MED ORDER — POTASSIUM CHLORIDE CRYS ER 20 MEQ PO TBCR
60.0000 meq | EXTENDED_RELEASE_TABLET | Freq: Once | ORAL | Status: AC
  Administered 2013-06-24: 15:00:00 60 meq via ORAL
  Filled 2013-06-24: qty 3

## 2013-06-24 NOTE — Progress Notes (Signed)
[facility] PROGRESS NOTE  Carol Rodriguez ZOX:[id number] DOB: 1971/05/14 DOA: 06/22/2013 PCP: Allean Found, MD  HPI/Subjective: 42 yo WF with no pertinent PMH presented to [facility] with bilateral swollen arms, CPK 7500, and Cr 1.1.  She was doing crossfit workouts 3x/day and drinking protein shakes after each workout in preparation for a crossfit competition which she competed in on Saturday 06/19/13.  Sunday she presented to the ED with one swollen arm and CK 2500.  She was hydrated and D/C home.  She returned yesterday 06/22/13 with both arms swollen and an increase in CPK, CK-MB, and Cr.    Doing much better.  Swelling in upper extremities is waxing and waning, but she is still able to move them.  No numbness or tingling noted in fingers.  She denies any nausea, vomiting, or urinary changes.  Assessment/Plan:  Rhabdomyolysis: - Likely secondary to strenuous workouts and increased intake of protein - Cr was 1.1 on admission; currently 0.84 (06/24/13) - IVF with bicarb bolus - Will continue to monitor, monitor Intake and output closely  AKI: - Cr was 1.1 on admission, currently 0.84 (10/2) - I&O  - Continue IVF - Will continue to monitor  Elevated LFTs: - Secondary effect to elevated CK - Should decline as CK declines - Will continue to monitor  Nausea and Vomiting: - unknown etiology currently; may be secondary to pain - Last episode vomiting was 10/1 0715 - Was given Zofran, but denies effectiveness - Phenergan IV on 06/23/13 was effective.  Has not needed anymore doses since then. - Will continue to monitor  Code Status: Full Family Communication:  Disposition Plan: Remain inpatient   Consultants:  None  Procedures:  None  Antibiotics:  None      Objective: Filed Vitals:   06/24/13 0612  BP: 111/72  Pulse: 59  Temp: 98.6 F (37 C)  Resp: 18    Intake/Output Summary (Last 24 hours) at 06/24/13 1256 Last data filed at 06/24/13 0934  Gross per  24 hour  Intake   3605 ml  Output   3700 ml  Net    -95 ml   Filed Weights   06/22/13 0918 06/23/13 0500 06/24/13 0612  Weight: 74.9 kg (165 lb 2 oz) 80.2 kg (176 lb 12.9 oz) 76.2 kg (167 lb 15.9 oz)    Exam:   General:   Sitting in bed and in no apparent distress  Cardiovascular: RRR, no m/g/r.  Distal pulses intact and symmetrical.  Cap refill < 3 sec.  Respiratory: Clear to auscultation bilaterally.  No rales, rhonchi, or wheezes.  Abdomen: Soft, non-tender.  No masses, hernias palpated.  No organomegaly.  Bowel sounds heard in all 4 quadrants  Musculoskeletal: Limited ROM in upper extremities bilaterally due to edema.  4/5 Strength upper, 5/5 strength lower.  Swelling present in upper extremities bilaterally to just distal to the elbow.    Normal sensations B/L upper and lower extremities.  No peripheral edema in distal upper extremities or lower extremities.   Data Reviewed: Basic Metabolic Panel:  Recent Labs Lab 06/20/13 0721 06/22/13 0520 06/22/13 1229  06/22/13 2019 06/23/13 0042 06/23/13 0345 06/23/13 0800 06/23/13 1100 06/24/13 0415  NA 138 138 139  --   --  141  --   --   --  144  K 4.7 3.9 4.1  < > 3.6 3.9 3.6 3.3* 3.4* 3.6  CL 103 103 105  --   --  112  --   --   --  113*  CO2 28 27 27   --   --  20  --   --   --  23  GLUCOSE 101* 71 97  --   --  93  --   --   --  90  BUN 19 20 16   --   --  11  --   --   --  8  CREATININE 1.00 1.10 1.35*  --  1.04 0.85 0.82 0.77 0.74 0.84  CALCIUM 9.5 9.4 8.4  --   --  6.8*  --   --   --  7.8*  MG  --   --  1.9  --   --   --   --   --   --   --   PHOS  --   --  4.1  < > 2.9 3.1 2.8 2.0* 2.5  --   < > = values in this interval not displayed. Liver Function Tests:  Recent Labs Lab 06/22/13 1229 06/23/13 0042 06/24/13 0415  AST 160* 211* 282*  ALT 59* 76* 105*  ALKPHOS 31* 28* 27*  BILITOT 0.5 0.7 0.8  PROT 6.0 5.6* 5.2*  ALBUMIN 3.1* 2.8* 2.7*   CBC:  Recent Labs Lab 06/20/13 0721 06/22/13 1229  WBC 6.8  6.6  NEUTROABS 4.1 4.3  HGB 13.9 12.7  HCT 39.9 37.4  MCV 93.0 92.8  PLT 180 172   Cardiac Enzymes:  Recent Labs Lab 06/23/13 0042 06/23/13 0345 06/23/13 0800 06/23/13 1100 06/24/13 0415  CKTOTAL 40981* 11829* 14890* 16003* 13073*  CKMB 9.2* 8.6* 9.9* 10.9* 7.7*    Scheduled Meds: . aspirin EC  81 mg Oral Daily  . enoxaparin (LOVENOX) injection  40 mg Subcutaneous Q24H   Continuous Infusions: . sodium chloride 1,000 mL (06/24/13 0722)    Principal Problem:   Rhabdomyolysis Active Problems:   Acute kidney injury   Joycelyn Man, PA-S  [facility] Pager [phone]. If 7PM-7AM, please contact night-coverage at [email or web address], password [facility] 06/24/2013, 12:56 PM  LOS: 2 days   Addendum  Patient seen and examined, chart and data base reviewed.  I agree with the above assessment and plan.  For full details please see Mrs. Joycelyn Man, PA-S note.  I reviewed and addended the above note.   Clint Lipps, MD [facility] Pager: [phone] 06/24/2013, 2:26 PM

## 2013-06-24 NOTE — Care Management Note (Addendum)
    Page 1 of 1   06/25/2013     2:21:05 PM   CARE MANAGEMENT NOTE 06/25/2013  Patient:  [facility]   Account Number:  [id number]  Date Initiated:  06/24/2013  Documentation initiated by:  Letha Cape  Subjective/Objective Assessment:   dx rhabdomyolysis  admit- lives with family.  pta indep.     Action/Plan:   Anticipated DC Date:  06/25/2013   Anticipated DC Plan:  HOME/SELF CARE      DC Planning Services  CM consult      Choice offered to / List presented to:             Status of service:  Completed, signed off Medicare Important Message given?   (If response is "NO", the following Medicare IM given date fields will be blank) Date Medicare IM given:   Date Additional Medicare IM given:    Discharge Disposition:  HOME/SELF CARE  Per UR Regulation:  Reviewed for med. necessity/level of care/duration of stay  If discussed at Long Length of Stay Meetings, dates discussed:    Comments:  06/25/13 14:20 Letha Cape RN, BSN [phone] pt dc to home.  06/24/13 12:46 Letha Cape RN, BSN [phone] Patient lives with family, pta indep.  Patient has medication and transportation at dc , no needs anticipated.

## 2013-06-25 LAB — COMPREHENSIVE METABOLIC PANEL
ALT: 156 U/L — ABNORMAL HIGH (ref 0–35)
Alkaline Phosphatase: 29 U/L — ABNORMAL LOW (ref 39–117)
BUN: 12 mg/dL (ref 6–23)
CO2: 26 mEq/L (ref 19–32)
Chloride: 106 mEq/L (ref 96–112)
GFR calc Af Amer: 88 mL/min — ABNORMAL LOW (ref >90)
GFR calc non Af Amer: 76 mL/min — ABNORMAL LOW (ref >90)
Glucose, Bld: 95 mg/dL (ref 70–99)
Potassium: 4 mEq/L (ref 3.5–5.1)
Sodium: 140 mEq/L (ref 135–145)
Total Bilirubin: 0.5 mg/dL (ref 0.3–1.2)
Total Protein: 5.5 g/dL — ABNORMAL LOW (ref 6.0–8.3)

## 2013-06-25 NOTE — Discharge Summary (Addendum)
Physician Discharge Summary  Carol Rodriguez QMV:[id number] DOB: 07/25/1971 DOA: 06/22/2013  PCP: Allean Found, MD  Admit date: 06/22/2013 Discharge date: 06/25/2013  Time spent: 45 minutes  Recommendations for Outpatient Follow-up:  1. Please recheck CMP and CK-MB/Total at follow-up  2. Educate patient on importance of limiting strenuous physical activity to prevent recurrence  Discharge Diagnoses:  Principal Problem:   Rhabdomyolysis Active Problems:   Acute kidney injury   Discharge Condition: Stable  Diet recommendation: Low sodium, heart healthy diet  Filed Weights   06/23/13 0500 06/24/13 0612 06/25/13 0627  Weight: 80.2 kg (176 lb 12.9 oz) 76.2 kg (167 lb 15.9 oz) 78.1 kg (172 lb 2.9 oz)    History of present illness:  42 yo WF with no pertinent PMH presented to [facility] with bilateral swollen arms, CPK 7500, and Cr 1.1. She was doing crossfit workouts 3x/day and drinking protein shakes after each workout in preparation for a crossfit competition which she competed in on Saturday 06/19/13. Sunday she presented to the ED with one swollen arm and CK 2500. She was hydrated and D/C home. She returned yesterday 06/22/13 with both arms swollen and an increase in CPK, CK-MB, and Cr.    Hospital Course:  Rhabdomyolysis:  - Likely secondary to strenuous workouts and increased intake of protein  - Cr was 1.1 on admission; currently 0.92 (06/25/13)  - IVF with bicarb bolus, monitored I&O - CK-MB and CK-Total continue declining trend - Recheck CMP and CK-MB/Total with PCP in one week  AKI:  - Cr was 1.1 on admission, currently 0.92 (10/3)  - IVF  - Continue hydration at home  Nausea and Vomiting:  - unknown etiology currently; may be secondary to pain  - Last episode vomiting was 10/1 0715  - Was given Zofran, but denies effectiveness  - Phenergan IV given once - Nausea has not persisted      Procedures:  None  Consultations:  None  Discharge Exam: Filed Vitals:    06/25/13 0627  BP: 103/68  Pulse: 55  Temp: 97.9 F (36.6 C)  Resp: 18    General: Sitting in bed and in no apparent distress Cardiovascular: RRR, no m/g/r. Distal pulses intact and symmetrical. Cap refill < 3 sec.  Respiratory: Clear to auscultation bilaterally. No rales, rhonchi, or wheezes.  Abdomen: Soft, non-tender. No masses, hernias palpated. No organomegaly. Bowel sounds heard in all 4 quadrants  Musculoskeletal: Limited ROM in upper extremities bilaterally due to edema. 4/5 Strength upper, 5/5 strength lower. Swelling present in upper extremities bilaterally to just distal to the elbow. Normal sensations B/L upper and lower extremities. No peripheral edema in distal upper extremities or lower extremities. Psychiatric: Alert and oriented x 3.  Very cooperative.  Good affect.  Responds appropriately.   Discharge Instructions      Discharge Orders   Future Orders Complete By Expires   Call MD for:  severe uncontrolled pain  As directed    Call MD for:  As directed    Comments:     Increased swelling, numbness, loss of color, decreased pulse in upper extremities, especially hands.   Discharge instructions  As directed    Comments:     Please check CMP and CK-MB/Total at follow up.   Increase activity slowly  As directed    Lifting restrictions  As directed    Comments:     Do not lift anything above 15 pounds for 4 weeks.  Then may gradually try to increase activity.  Medication List         Fish Oil 1200 MG Cpdr  Take 2,400 mg by mouth 3 (three) times daily.     Iodine-Vitamin A [phone] MG-UNIT Caps  Take 1 capsule by mouth daily.     liothyronine 5 MCG tablet  Commonly known as:  CYTOMEL  Take 5-10 mcg by mouth See admin instructions. Pt takes 2 tabs in the morning and 1 tablet at 7pm     magnesium gluconate 30 MG tablet  Commonly known as:  MAGONATE  Take 30-60 mg by mouth 2 (two) times daily. 1 tab in the morning and 2 tabs at night      Melatonin 5 MG Tabs  Take 5 mg by mouth daily.     OVER THE COUNTER MEDICATION  Take 1 tablet by mouth 4 (four) times daily. cytozyme AD. Dietary supplement     OVER THE COUNTER MEDICATION  Take 1 tablet by mouth 3 (three) times daily. Pt takes an 5htp time release 100 mg     progesterone 100 MG capsule  Commonly known as:  PROMETRIUM  Take 100 mg by mouth daily. Pt takes 15-18 day of cycle 1 . 2 caps on day 19-23 of cycle. 3 caps 24-28 of cycle     saccharomyces boulardii 250 MG capsule  Commonly known as:  FLORASTOR  Take 250 mg by mouth daily.     VITAMIN A PO  Take 1 tablet by mouth daily.     Vitamin D3 5000 UNITS Tabs  Take 5,000 Units by mouth daily.       Allergies  Allergen Reactions  . Eggs Or Egg-Derived Products Other (See Comments)    Egg whites-    Follow-up Information   Follow up with Allean Found, MD. Schedule an appointment as soon as possible for a visit in 1 week.   Specialty:  Family Medicine   Contact information:   [address] [place] [address] [phone]        The results of significant diagnostics from this hospitalization (including imaging, microbiology, ancillary and laboratory) are listed below for reference.      Labs: Basic Metabolic Panel:  Recent Labs Lab 06/22/13 0520 06/22/13 1229  06/22/13 2019 06/23/13 0042 06/23/13 0345 06/23/13 0800 06/23/13 1100 06/24/13 0415 06/25/13 0452  NA 138 139  --   --  141  --   --   --  144 140  K 3.9 4.1  < > 3.6 3.9 3.6 3.3* 3.4* 3.6 4.0  CL 103 105  --   --  112  --   --   --  113* 106  CO2 27 27  --   --  20  --   --   --  23 26  GLUCOSE 71 97  --   --  93  --   --   --  90 95  BUN 20 16  --   --  11  --   --   --  8 12  CREATININE 1.10 1.35*  --  1.04 0.85 0.82 0.77 0.74 0.84 0.92  CALCIUM 9.4 8.4  --   --  6.8*  --   --   --  7.8* 8.4  MG  --  1.9  --   --   --   --   --   --   --   --   PHOS  --  4.1  < > 2.9 3.1 2.8  2.0* 2.5  --   --   < > =  values in this interval not displayed. Liver Function Tests:  Recent Labs Lab 06/22/13 1229 06/23/13 0042 06/24/13 0415 06/25/13 0452  AST 160* 211* 282* 318*  ALT 59* 76* 105* 156*  ALKPHOS 31* 28* 27* 29*  BILITOT 0.5 0.7 0.8 0.5  PROT 6.0 5.6* 5.2* 5.5*  ALBUMIN 3.1* 2.8* 2.7* 2.9*   CBC:  Recent Labs Lab 06/20/13 0721 06/22/13 1229  WBC 6.8 6.6  NEUTROABS 4.1 4.3  HGB 13.9 12.7  HCT 39.9 37.4  MCV 93.0 92.8  PLT 180 172   Cardiac Enzymes:  Recent Labs Lab 06/23/13 0345 06/23/13 0800 06/23/13 1100 06/24/13 0415 06/25/13 0452  CKTOTAL [address]* 14890* 16003* 13073* 11674*  CKMB 8.6* 9.9* 10.9* 7.7* 7.3*      Signed:  Joycelyn Man, PA-S  [facility] 06/25/2013, 11:50 AM   Addendum  Patient seen and examined, chart and data base reviewed.  I agree with the above assessment and plan.  For full details please see Mrs. Joycelyn Man, PA-S note.  The above note is reviewed and addended by me.   Clint Lipps, MD [facility] Pager: [phone] 06/25/2013, 11:50 AM

## 2013-06-25 NOTE — Progress Notes (Signed)
Pt given discharge instuctions.  PIV removed.  Pt wheeled down to discharge location via wheelchair.

## 2013-11-24 ENCOUNTER — Other Ambulatory Visit: Payer: Self-pay

## 2013-11-24 DIAGNOSIS — Z1231 Encounter for screening mammogram for malignant neoplasm of breast: Secondary | ICD-10-CM

## 2013-12-14 ENCOUNTER — Ambulatory Visit
Admission: RE | Admit: 2013-12-14 | Discharge: 2013-12-14 | Disposition: A | Discharge status: Home / Self Care | Payer: BC Managed Care – PPO | Source: Ambulatory Visit

## 2013-12-14 DIAGNOSIS — Z1231 Encounter for screening mammogram for malignant neoplasm of breast: Secondary | ICD-10-CM

## 2014-02-16 ENCOUNTER — Encounter (HOSPITAL_COMMUNITY): Payer: Self-pay | Admitting: Emergency Medicine

## 2014-02-16 ENCOUNTER — Emergency Department (HOSPITAL_COMMUNITY)
Admission: EM | Admit: 2014-02-16 | Discharge: 2014-02-16 | Disposition: A | Discharge status: Home / Self Care | Payer: BC Managed Care – PPO | Attending: Emergency Medicine | Admitting: Emergency Medicine

## 2014-02-16 DIAGNOSIS — E2749 Other adrenocortical insufficiency: Secondary | ICD-10-CM | POA: Insufficient documentation

## 2014-02-16 DIAGNOSIS — Z79899 Other long term (current) drug therapy: Secondary | ICD-10-CM | POA: Insufficient documentation

## 2014-02-16 DIAGNOSIS — Z8739 Personal history of other diseases of the musculoskeletal system and connective tissue: Secondary | ICD-10-CM | POA: Insufficient documentation

## 2014-02-16 DIAGNOSIS — Z23 Encounter for immunization: Secondary | ICD-10-CM | POA: Insufficient documentation

## 2014-02-16 DIAGNOSIS — M129 Arthropathy, unspecified: Secondary | ICD-10-CM | POA: Insufficient documentation

## 2014-02-16 DIAGNOSIS — Z203 Contact with and (suspected) exposure to rabies: Secondary | ICD-10-CM | POA: Insufficient documentation

## 2014-02-16 MED ORDER — RABIES VACCINE, PCEC IM SUSR
1.0000 mL | Freq: Once | INTRAMUSCULAR | Status: AC
  Administered 2014-02-16: 1 mL via INTRAMUSCULAR
  Filled 2014-02-16: qty 1

## 2014-02-16 MED ORDER — RABIES VACCINE, PCEC IM SUSR: 1.0000 mL | Freq: Once | INTRAMUSCULAR | Status: DC

## 2014-02-16 MED ORDER — RABIES IMMUNE GLOBULIN 150 UNIT/ML IM INJ
20.0000 [IU]/kg | INJECTION | Freq: Once | INTRAMUSCULAR | Status: AC
  Administered 2014-02-16: 1500 [IU] via INTRAMUSCULAR
  Filled 2014-02-16: qty 10

## 2014-02-16 MED ORDER — RABIES IMMUNE GLOBULIN 150 UNIT/ML IM INJ
20.0000 [IU]/kg | INJECTION | Freq: Once | INTRAMUSCULAR | Status: DC
Start: 2014-02-16 — End: 2014-02-16

## 2014-02-16 NOTE — ED Provider Notes (Signed)
CSN: [id number]     Arrival date & time 02/16/14  1557 History  This chart was scribed for non-physician practitioner, Elpidio Anis, PA-C working with Shon Baton, MD by Greggory Stallion, ED scribe. This patient was seen in room WTR6/WTR6 and the patient's care was started at 4:10 PM.   Chief Complaint  Patient presents with  . possible bat bite    The history is provided by the patient. No language interpreter was used.   HPI Comments: Carol Rodriguez is a 43 y.o. female who presents to the Emergency Department complaining of bat exposure. Pt states her husband found a bat in their fish tank. She had no direct contact with the bat and has no known bites.   Past Medical History  Diagnosis Date  . Rhabdomyolysis 06/21/2013  . Complication of anesthesia     HAS NEVER HAD ANESTHESIA  . Thyroid disorder   . Arthritis   . Adrenal cortical insufficiency    History reviewed. No pertinent past surgical history. No family history on file. History  Substance Use Topics  . Smoking status: Never Smoker   . Smokeless tobacco: Never Used  . Alcohol Use: Yes     Comment: social   OB History   Grav Para Term Preterm Abortions TAB SAB Ect Mult Living                 Review of Systems  All other systems reviewed and are negative.  Allergies  Eggs or egg-derived products  Home Medications   Prior to Admission medications   Medication Sig Start Date End Date Taking? Authorizing Provider  Cholecalciferol (VITAMIN D3) 5000 UNITS TABS Take 5,000 Units by mouth daily.    Historical Provider, MD  Iodine-Vitamin A [phone] MG-UNIT CAPS Take 1 capsule by mouth daily.    Historical Provider, MD  liothyronine (CYTOMEL) 5 MCG tablet Take 5-10 mcg by mouth See admin instructions. Pt takes 2 tabs in the morning and 1 tablet at 7pm    Historical Provider, MD  magnesium gluconate (MAGONATE) 30 MG tablet Take 30-60 mg by mouth 2 (two) times daily. 1 tab in the morning and 2 tabs at night     Historical Provider, MD  Melatonin 5 MG TABS Take 5 mg by mouth daily.    Historical Provider, MD  Omega-3 Fatty Acids (FISH OIL) 1200 MG CPDR Take 2,400 mg by mouth 3 (three) times daily.    Historical Provider, MD  OVER THE COUNTER MEDICATION Take 1 tablet by mouth 4 (four) times daily. cytozyme AD. Dietary supplement    Historical Provider, MD  OVER THE COUNTER MEDICATION Take 1 tablet by mouth 3 (three) times daily. Pt takes an 5htp time release 100 mg    Historical Provider, MD  progesterone (PROMETRIUM) 100 MG capsule Take 100 mg by mouth daily. Pt takes 15-18 day of cycle 1 . 2 caps on day 19-23 of cycle. 3 caps 24-28 of cycle    Historical Provider, MD  saccharomyces boulardii (FLORASTOR) 250 MG capsule Take 250 mg by mouth daily.    Historical Provider, MD  VITAMIN A PO Take 1 tablet by mouth daily.    Historical Provider, MD   BP 126/80  Pulse 83  Temp(Src) 98.7 F (37.1 C) (Oral)  Resp 16  Wt 161 lb 6.4 oz (73.211 kg)  SpO2 100%  LMP 01/28/2014  Physical Exam  Nursing note and vitals reviewed. Constitutional: She is oriented to person, place, and time. She appears well-developed and well-nourished.  No distress.  HENT:  Head: Normocephalic and atraumatic.  Eyes: EOM are normal.  Neck: Neck supple. No tracheal deviation present.  Cardiovascular: Normal rate.   Pulmonary/Chest: Effort normal. No respiratory distress.  Musculoskeletal: Normal range of motion.  Neurological: She is alert and oriented to person, place, and time.  Skin: Skin is warm and dry.  Psychiatric: She has a normal mood and affect. Her behavior is normal.    ED Course  Procedures (including critical care time)  DIAGNOSTIC STUDIES: Oxygen Saturation is 100% on RA, normal by my interpretation.    COORDINATION OF CARE: 4:12 PM-Discussed treatment plan which includes rabies vaccination with pt at bedside and pt agreed to plan.   Labs Review Labs Reviewed - No data to display  Imaging Review No  results found.   EKG Interpretation None      MDM   Final diagnoses:  None    1. Rabies exposure  Asymptomatic. Rabies prophylactic given based on bat found in the house.  I personally performed the services described in this documentation, which was scribed in my presence. The recorded information has been reviewed and is accurate.  Arnoldo HookerShari A Durell Lofaso, PA-C 02/16/14 1902  Arnoldo HookerShari A Eufemio Strahm, PA-C 02/16/14 1904

## 2014-02-16 NOTE — ED Notes (Signed)
Bat found in pt's house. No known bat bite.

## 2014-02-16 NOTE — Discharge Instructions (Signed)
Rabies  Rabies is a viral infection that can be spread to people from infected animals. The infection affects the brain and central nervous system. Once the disease develops, it almost always causes death. Because of this, when a person is bitten by an animal that may have rabies, treatment to prevent rabies often needs to be started whether or not the animal is known to be infected. Prompt treatment with the rabies vaccine and rabies immune globulin is very effective at preventing the infection from developing in people who have been exposed to the rabies virus. CAUSES  Rabies is caused by a virus that lives inside some animals. When a person is bitten by an infected animal, the rabies virus is spread to the person through the infected spit (saliva) of the animal. This virus can be carried by animals such as dogs, cats, skunks, bats, woodchucks, raccoons, coyotes, and foxes. SYMPTOMS  By the time symptoms appear, rabies is usually fatal for the person. Common symptoms include:  Headache.  Fever.  Fatigue and weakness.  Agitation.  Anxiety.  Confusion.  Unusual behavior, such as hyperactivity, fear of water (hydrophobia), or fear of air (aerophobia).  Hallucinations.  Insomnia.  Weakness in the arms or legs.  Difficulty swallowing. Most people get sick in 1 3 months after being bitten. This often varies and may depend on the location of the bite. The infection will take less time to develop if the bite occurred closer to the head.  DIAGNOSIS  To determine if a person is infected, several tests must be performed, such as:  A skin biopsy.  A saliva test.  A lumbar puncture to remove spinal fluid so it can be examined.  Blood tests. TREATMENT  Treatment to prevent the infection from developing (post-exposure prophylaxis, PEP) is often started before knowing for sure if the person has been exposed to the rabies virus. PEP involves cleaning the wound, giving an antibody injection  (rabies immune globulin), and giving a series of rabies vaccine injections. The series of injections are usually given over a two-week period. If possible, the animal that bit the person will be observed to see if it remains healthy. If the animal has been killed, it can be sent to a state laboratory and examined to see if the animal had rabies. If a person is bitten by a domestic animal (dog, cat, or ferret) that appears healthy and can be observed to see if it remains healthy, often no further treatment is necessary other than care of the wounds caused by the animal. Rabies is often a fatal illness once the infection develops in a person. Although a few people who developed rabies have survived after experimental treatment with certain drugs, all these survivors still had severe nervous system problems after the treatment. This is why caregivers use extra caution and begin PEP treatment for people who have been bitten by animals that are possibly infected with rabies.  HOME CARE INSTRUCTIONS  If you were bitten by an unknown animal, make sure you know your caregiver's instructions for follow-up. If the animal was sent to a laboratory for examination, ask when the test results will be ready. Make sure you get the test results.  Take these steps to care for your wound:  Keep the wound clean, dry, and dressed as directed by your caregiver.  Keep the injured part elevated as much as possible.  Do not resume use of the affected area until directed.  Only take over-the-counter or prescription medicines as directed   by your caregiver.  Keep all follow-up appointments as directed by your caregiver. PREVENTION  To prevent rabies, people need to reduce their risk of having contact with infected animals.   Make sure your pets (dogs, cats, ferrets) are vaccinated against rabies. Keep these vaccinations up-to-date as directed by your veterinarian.  Supervise your pets when they are outside. Keep them away  from wild animals.  Call your local animal control services to report any stray animals. These animals may not be vaccinated.  Stay away from stray or wild animals.  Consider getting the rabies vaccine (preexposure) if you are traveling to an area where rabies is common or if your job or activities involve possible contact with wild or stray animals. Discuss this with your caregiver. Document Released: 09/09/2005 Document Revised: 06/03/2012 Document Reviewed: 04/07/2012 [employer] Patient Information 2014 [employer].  

## 2014-02-17 NOTE — ED Provider Notes (Signed)
Medical screening examination/treatment/procedure(s) were performed by non-physician practitioner and as supervising physician I was immediately available for consultation/collaboration.   EKG Interpretation None        Paislynn Hegstrom F Grover Robinson, MD 02/17/14 0026 

## 2014-02-18 ENCOUNTER — Emergency Department (HOSPITAL_COMMUNITY)
Admission: EM | Admit: 2014-02-18 | Discharge: 2014-02-18 | Disposition: A | Discharge status: Home / Self Care | Payer: BC Managed Care – PPO | Source: Home / Self Care

## 2014-02-18 ENCOUNTER — Encounter (HOSPITAL_COMMUNITY): Payer: Self-pay | Admitting: Emergency Medicine

## 2014-02-18 DIAGNOSIS — Z203 Contact with and (suspected) exposure to rabies: Secondary | ICD-10-CM

## 2014-02-18 MED ORDER — RABIES VACCINE, PCEC IM SUSR
1.0000 mL | Freq: Once | INTRAMUSCULAR | Status: AC
  Administered 2014-02-18: 1 mL via INTRAMUSCULAR

## 2014-02-18 MED ORDER — RABIES VACCINE, PCEC IM SUSR
INTRAMUSCULAR | Status: AC
  Filled 2014-02-18: qty 1

## 2014-02-18 NOTE — ED Notes (Signed)
Here for day #3 in rabies series. NAD, no c/o  

## 2014-02-22 ENCOUNTER — Encounter (HOSPITAL_COMMUNITY): Payer: Self-pay | Admitting: Emergency Medicine

## 2014-02-22 ENCOUNTER — Emergency Department (HOSPITAL_COMMUNITY)
Admission: EM | Admit: 2014-02-22 | Discharge: 2014-02-22 | Disposition: A | Discharge status: Home / Self Care | Payer: BC Managed Care – PPO | Source: Home / Self Care

## 2014-02-22 DIAGNOSIS — Z203 Contact with and (suspected) exposure to rabies: Secondary | ICD-10-CM

## 2014-02-22 MED ORDER — RABIES VACCINE, PCEC IM SUSR
1.0000 mL | Freq: Once | INTRAMUSCULAR | Status: AC
  Administered 2014-02-22: 1 mL via INTRAMUSCULAR

## 2014-02-22 MED ORDER — RABIES VACCINE, PCEC IM SUSR
INTRAMUSCULAR | Status: AC
  Filled 2014-02-22: qty 1

## 2014-02-22 NOTE — ED Notes (Signed)
Pt here for rabies injection. Voices no concerns at this time.  

## 2014-03-01 ENCOUNTER — Emergency Department (HOSPITAL_COMMUNITY)
Admission: EM | Admit: 2014-03-01 | Discharge: 2014-03-01 | Disposition: A | Discharge status: Home / Self Care | Payer: BC Managed Care – PPO | Source: Home / Self Care

## 2014-03-01 ENCOUNTER — Encounter (HOSPITAL_COMMUNITY): Payer: Self-pay | Admitting: Emergency Medicine

## 2014-03-01 DIAGNOSIS — Z203 Contact with and (suspected) exposure to rabies: Secondary | ICD-10-CM

## 2014-03-01 MED ORDER — RABIES VACCINE, PCEC IM SUSR
1.0000 mL | Freq: Once | INTRAMUSCULAR | Status: AC
  Administered 2014-03-01: 1 mL via INTRAMUSCULAR

## 2014-03-01 MED ORDER — RABIES VACCINE, PCEC IM SUSR
INTRAMUSCULAR | Status: AC
  Filled 2014-03-01: qty 1

## 2014-03-01 NOTE — ED Notes (Signed)
Pt is  Here  For  The  Last in  Series  Of  Rabies  Injections

## 2014-03-01 NOTE — Discharge Instructions (Signed)
congrats  You  Are  finished °

## 2015-09-19 ENCOUNTER — Emergency Department (HOSPITAL_BASED_OUTPATIENT_CLINIC_OR_DEPARTMENT_OTHER): Payer: BC Managed Care – PPO

## 2015-09-19 ENCOUNTER — Encounter (HOSPITAL_BASED_OUTPATIENT_CLINIC_OR_DEPARTMENT_OTHER): Payer: Self-pay | Admitting: *Deleted

## 2015-09-19 ENCOUNTER — Emergency Department (HOSPITAL_BASED_OUTPATIENT_CLINIC_OR_DEPARTMENT_OTHER)
Admission: EM | Admit: 2015-09-19 | Discharge: 2015-09-19 | Disposition: A | Discharge status: Home / Self Care | Payer: BC Managed Care – PPO | Attending: Emergency Medicine | Admitting: Emergency Medicine

## 2015-09-19 DIAGNOSIS — S42302A Unspecified fracture of shaft of humerus, left arm, initial encounter for closed fracture: Secondary | ICD-10-CM | POA: Insufficient documentation

## 2015-09-19 DIAGNOSIS — Z8739 Personal history of other diseases of the musculoskeletal system and connective tissue: Secondary | ICD-10-CM | POA: Insufficient documentation

## 2015-09-19 DIAGNOSIS — Z8639 Personal history of other endocrine, nutritional and metabolic disease: Secondary | ICD-10-CM | POA: Insufficient documentation

## 2015-09-19 DIAGNOSIS — W1839XA Other fall on same level, initial encounter: Secondary | ICD-10-CM | POA: Insufficient documentation

## 2015-09-19 DIAGNOSIS — Y9289 Other specified places as the place of occurrence of the external cause: Secondary | ICD-10-CM | POA: Insufficient documentation

## 2015-09-19 DIAGNOSIS — S4992XA Unspecified injury of left shoulder and upper arm, initial encounter: Secondary | ICD-10-CM | POA: Diagnosis present

## 2015-09-19 DIAGNOSIS — Z79899 Other long term (current) drug therapy: Secondary | ICD-10-CM | POA: Diagnosis not present

## 2015-09-19 DIAGNOSIS — Y93B9 Activity, other involving muscle strengthening exercises: Secondary | ICD-10-CM | POA: Diagnosis not present

## 2015-09-19 DIAGNOSIS — Y998 Other external cause status: Secondary | ICD-10-CM | POA: Insufficient documentation

## 2015-09-19 MED ORDER — SODIUM CHLORIDE 0.9 % IV SOLN
INTRAVENOUS | Status: DC
  Administered 2015-09-19: 12:00:00 via INTRAVENOUS

## 2015-09-19 MED ORDER — KETOROLAC TROMETHAMINE 15 MG/ML IJ SOLN
15.0000 mg | Freq: Once | INTRAMUSCULAR | Status: AC
  Administered 2015-09-19: 15 mg via INTRAVENOUS
  Filled 2015-09-19: qty 1

## 2015-09-19 MED ORDER — DIAZEPAM 5 MG/ML IJ SOLN
5.0000 mg | Freq: Once | INTRAMUSCULAR | Status: AC
  Administered 2015-09-19: 5 mg via INTRAVENOUS
  Filled 2015-09-19: qty 2

## 2015-09-19 MED ORDER — DIAZEPAM 5 MG PO TABS: 5.0000 mg | ORAL_TABLET | Freq: Three times a day (TID) | ORAL | Status: DC | PRN

## 2015-09-19 MED ORDER — HYDROMORPHONE HCL 1 MG/ML IJ SOLN
1.0000 mg | Freq: Once | INTRAMUSCULAR | Status: AC
  Administered 2015-09-19: 1 mg via INTRAVENOUS
  Filled 2015-09-19: qty 1

## 2015-09-19 MED ORDER — IBUPROFEN 600 MG PO TABS: 600.0000 mg | ORAL_TABLET | Freq: Four times a day (QID) | ORAL | Status: DC | PRN

## 2015-09-19 MED ORDER — ONDANSETRON HCL 4 MG/2ML IJ SOLN
4.0000 mg | Freq: Once | INTRAMUSCULAR | Status: AC
  Administered 2015-09-19: 4 mg via INTRAVENOUS
  Filled 2015-09-19: qty 2

## 2015-09-19 MED ORDER — OXYCODONE-ACETAMINOPHEN 5-325 MG PO TABS: 1.0000 | ORAL_TABLET | ORAL | Status: DC | PRN

## 2015-09-19 NOTE — ED Notes (Signed)
I applied a co-apptation splint (double sugar tong type). I first applied sock material up over shoulder, then applied thick layer of cotton webril and then applied fiberglass materials. I secured all with light wrap of large kerlix, then had nurse assist patient to bend forward holding thumb for traction. I then completed splint with ace wrap and had patient move back to sitting position of comfort. I applied sling.tient states feels better.

## 2015-09-19 NOTE — ED Notes (Signed)
Pt having splint placed at this time. No distress. VSS. Awaiting splint completion and disposition. Husband at side.

## 2015-09-19 NOTE — ED Notes (Addendum)
Pt states she was exercising and fell onto left shoulder and arm onto rubber mat. Pt has deformity of left upper arm. C/o low back pain. No LOC. Pt has nl pulses to left arm and nl sensation to fingers. Pt able to move fingers. EMS gave 200 mcg of fentanyl enroute to hospital.

## 2015-09-19 NOTE — ED Provider Notes (Signed)
CSN: [id number]     Arrival date & time 09/19/15  1139 History   First MD Initiated Contact with Patient 09/19/15 1148     No chief complaint on file.    (Consider location/radiation/quality/duration/timing/severity/associated sxs/prior Treatment) HPI   44yF with LUE pain. Acute onset just prior to arrival. TrentFell while exercising doing "toes to bar." Bar was wet and hands slipped. Fell onto L side. Persistent severe pain below shoulder since then. No numbness or tingling. Denies any other acute pain. Placed in sling by EMS prior to arrival and given fentanyl with some improvement of pain.   Past Medical History  Diagnosis Date  . Rhabdomyolysis 06/21/2013  . Complication of anesthesia     HAS NEVER HAD ANESTHESIA  . Thyroid disorder   . Arthritis   . Adrenal cortical insufficiency (HCC)    History reviewed. No pertinent past surgical history. No family history on file. Social History  Substance Use Topics  . Smoking status: Never Smoker   . Smokeless tobacco: Never Used  . Alcohol Use: Yes     Comment: social   OB History    No data available     Review of Systems  All systems reviewed and negative, other than as noted in HPI.   Allergies  Eggs or egg-derived products  Home Medications   Prior to Admission medications   Medication Sig Start Date End Date Taking? Authorizing Provider  Cholecalciferol (VITAMIN D3) 5000 UNITS TABS Take 5,000 Units by mouth daily.   Yes Historical Provider, MD  magnesium gluconate (MAGONATE) 30 MG tablet Take 30-60 mg by mouth 2 (two) times daily. 1 tab in the morning and 2 tabs at night   Yes Historical Provider, MD  Melatonin 5 MG TABS Take 5 mg by mouth daily.   Yes Historical Provider, MD  Omega-3 Fatty Acids (FISH OIL) 1200 MG CPDR Take 2,400 mg by mouth 3 (three) times daily.   Yes Historical Provider, MD  OVER THE COUNTER MEDICATION Take 1 tablet by mouth 2 (two) times daily. Pt takes an 5htp time release 100 mg    Historical  Provider, MD   BP 120/79 mmHg  Pulse 78  Temp(Src) 97.8 F (36.6 C) (Oral)  Resp 18  Ht 5\' 8"  (1.727 m)  Wt 155 lb (70.308 kg)  BMI 23.57 kg/m2  SpO2 100%  LMP 08/27/2015 Physical Exam  Constitutional: She appears well-developed and well-nourished. No distress.  HENT:  Head: Normocephalic and atraumatic.  Eyes: Conjunctivae are normal. Right eye exhibits no discharge. Left eye exhibits no discharge.  Neck: Neck supple.  Cardiovascular: Normal rate, regular rhythm and normal heart sounds.  Exam reveals no gallop and no friction rub.   No murmur heard. Pulmonary/Chest: Effort normal and breath sounds normal. No respiratory distress.  Abdominal: Soft. She exhibits no distension. There is no tenderness.  Musculoskeletal: She exhibits no edema or tenderness.  Swelling of mid to proximal third of the humerus. Tenderness to palpation in the same region. Clinically midshaft fracture with crepitus and abnormal movement. There is some soft tissue swelling in this area but it is not tense. Closed injury. Sensation is intact to light touch. She is not complaining of any paresthesias. Overlying skin is unremarkable in appearance. No coolness. Brisk cap refill in fingertips.   Neurological: She is alert.  Skin: Skin is warm and dry.  Psychiatric: She has a normal mood and affect. Her behavior is normal. Thought content normal.  Nursing note and vitals reviewed.   ED  Course  Procedures (including critical care time) Labs Review Labs Reviewed - No data to display  Imaging Review No results found.   Dg Humerus Left  09/19/2015  CLINICAL DATA:  Larey Seat doing cross fit today, mid humeral pain EXAM: LEFT HUMERUS - 2+ VIEW COMPARISON:  None FINDINGS: Displaced oblique fracture of the mid LEFT humeral diaphysis. Minimal apex posterior angulation. Shoulder and elbow joint alignments grossly normal for humeral exam. Visualized LEFT ribs intact. IMPRESSION: Angulated displaced mid diaphyseal fracture  LEFT humerus. Electronically Signed   By: Ulyses Southward M.D.   On: 09/19/2015 12:36   I have personally reviewed and evaluated these images and lab results as part of my medical decision-making.   EKG Interpretation None      MDM   Final diagnoses:  Closed fracture of humerus, left, initial encounter    44 year old female with a closed midshaft left humerus fracture. NVI. Some soft tissue swelling but no additional signs/symptoms to suggest compartment syndrome. Splint. Sling. Pain control. Anticipate discharge with ortho FU but will discuss first.     Raeford Razor, MD 09/29/15 1059

## 2016-06-03 ENCOUNTER — Other Ambulatory Visit: Payer: Self-pay | Admitting: Physician Assistant

## 2016-06-03 DIAGNOSIS — Z1231 Encounter for screening mammogram for malignant neoplasm of breast: Secondary | ICD-10-CM

## 2016-06-14 ENCOUNTER — Ambulatory Visit
Admission: RE | Admit: 2016-06-14 | Discharge: 2016-06-14 | Disposition: A | Discharge status: Home / Self Care | Payer: BC Managed Care – PPO | Source: Ambulatory Visit | Attending: Physician Assistant | Admitting: Physician Assistant

## 2016-06-14 DIAGNOSIS — Z1231 Encounter for screening mammogram for malignant neoplasm of breast: Secondary | ICD-10-CM

## 2016-08-01 ENCOUNTER — Telehealth: Payer: Self-pay | Admitting: Genetic Counselor

## 2016-08-01 NOTE — Telephone Encounter (Signed)
Messaged [name] about whether or not we did genetic counseling for what the pt was being referred for. KP [facility] and [facility] suggested that the pt see a hematologist to make sure she is not having any clotting issues. Cld the pt to make her aware that we do not test for the specific gene she is carrying and that her appt will be cancelled. Also explained that I would be notifying the referring office and let them know that she needs a hematology referral and to refer her to a different genetic counselor. Pt voiced understanding. Referring office, [place]lee, notified, lft a vm for her to call back.

## 2016-09-09 ENCOUNTER — Encounter: Payer: BC Managed Care – PPO | Admitting: Genetic Counselor

## 2016-09-09 ENCOUNTER — Other Ambulatory Visit: Payer: BC Managed Care – PPO

## 2017-08-12 IMAGING — DX DG HUMERUS 2V *L*
2 series · 2 of 2 positions shown · non-contrast
Comparison: None

CLINICAL DATA: Fell doing cross fit today, mid humeral pain

EXAM:
LEFT HUMERUS - 2+ VIEW

[humerus ap]
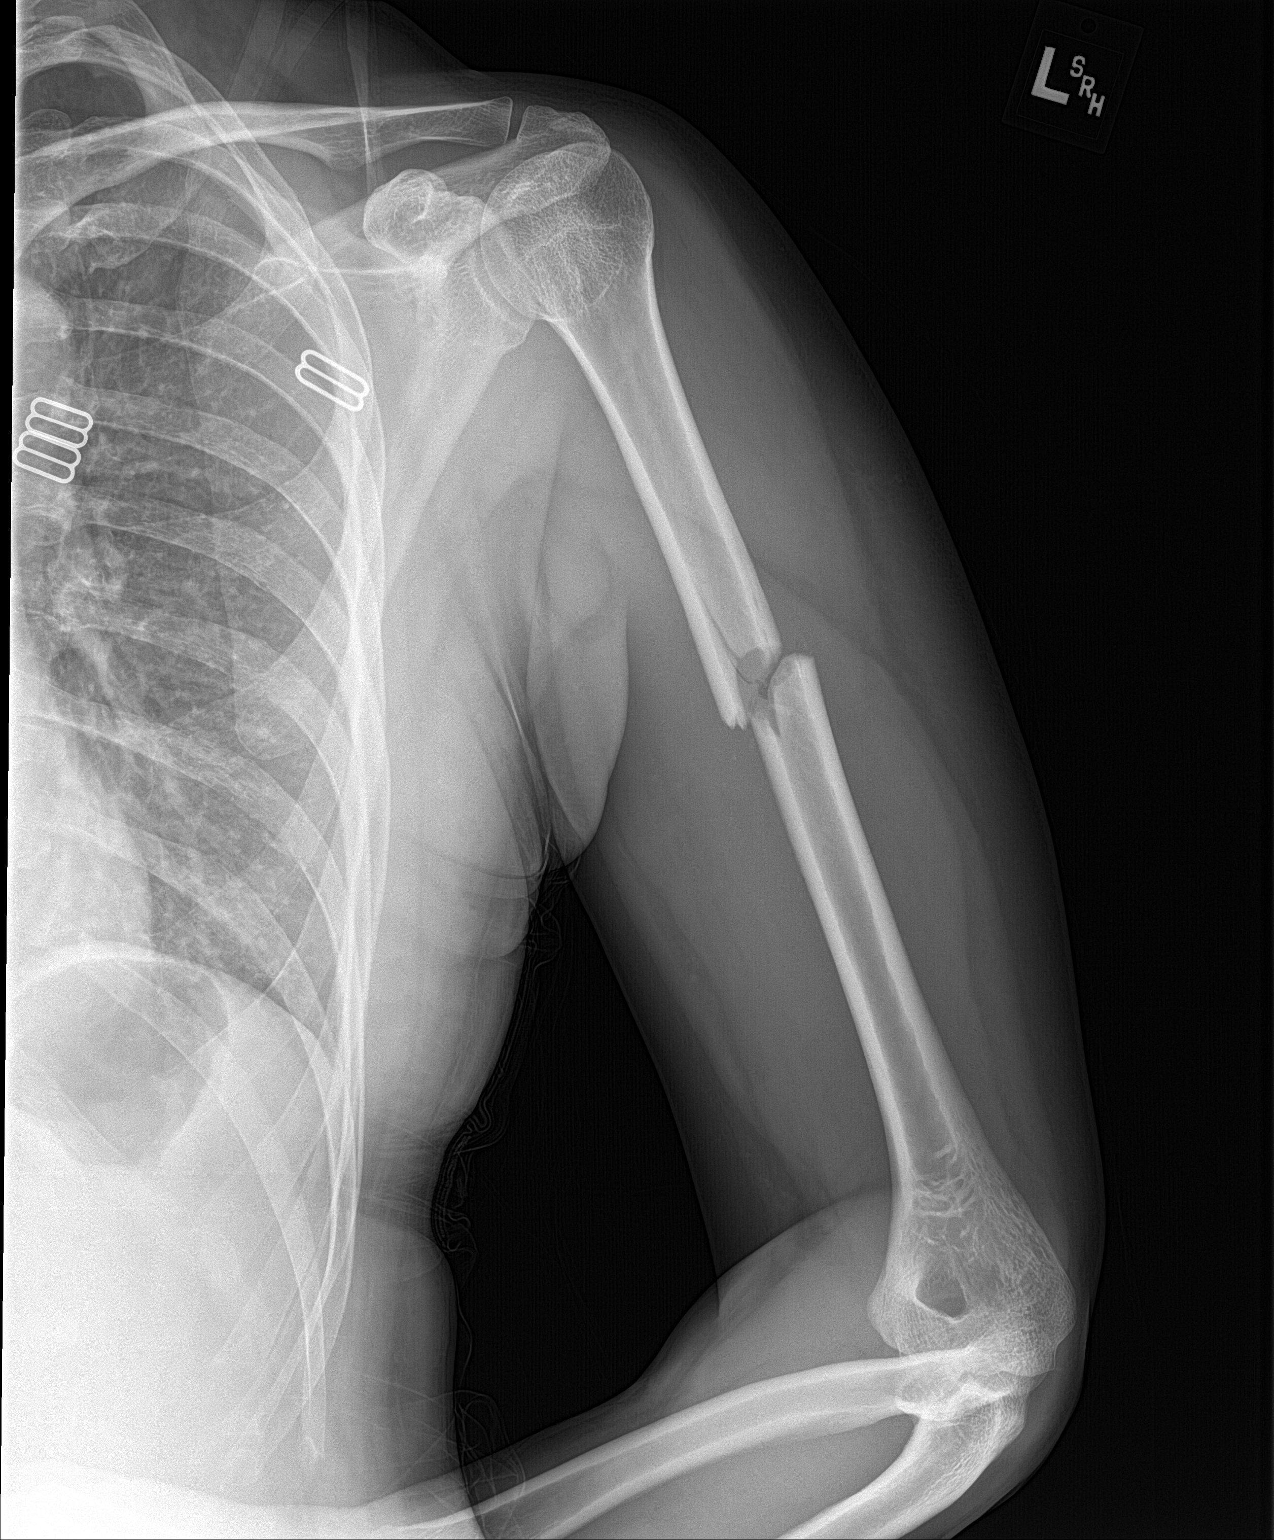

[humerus lat]
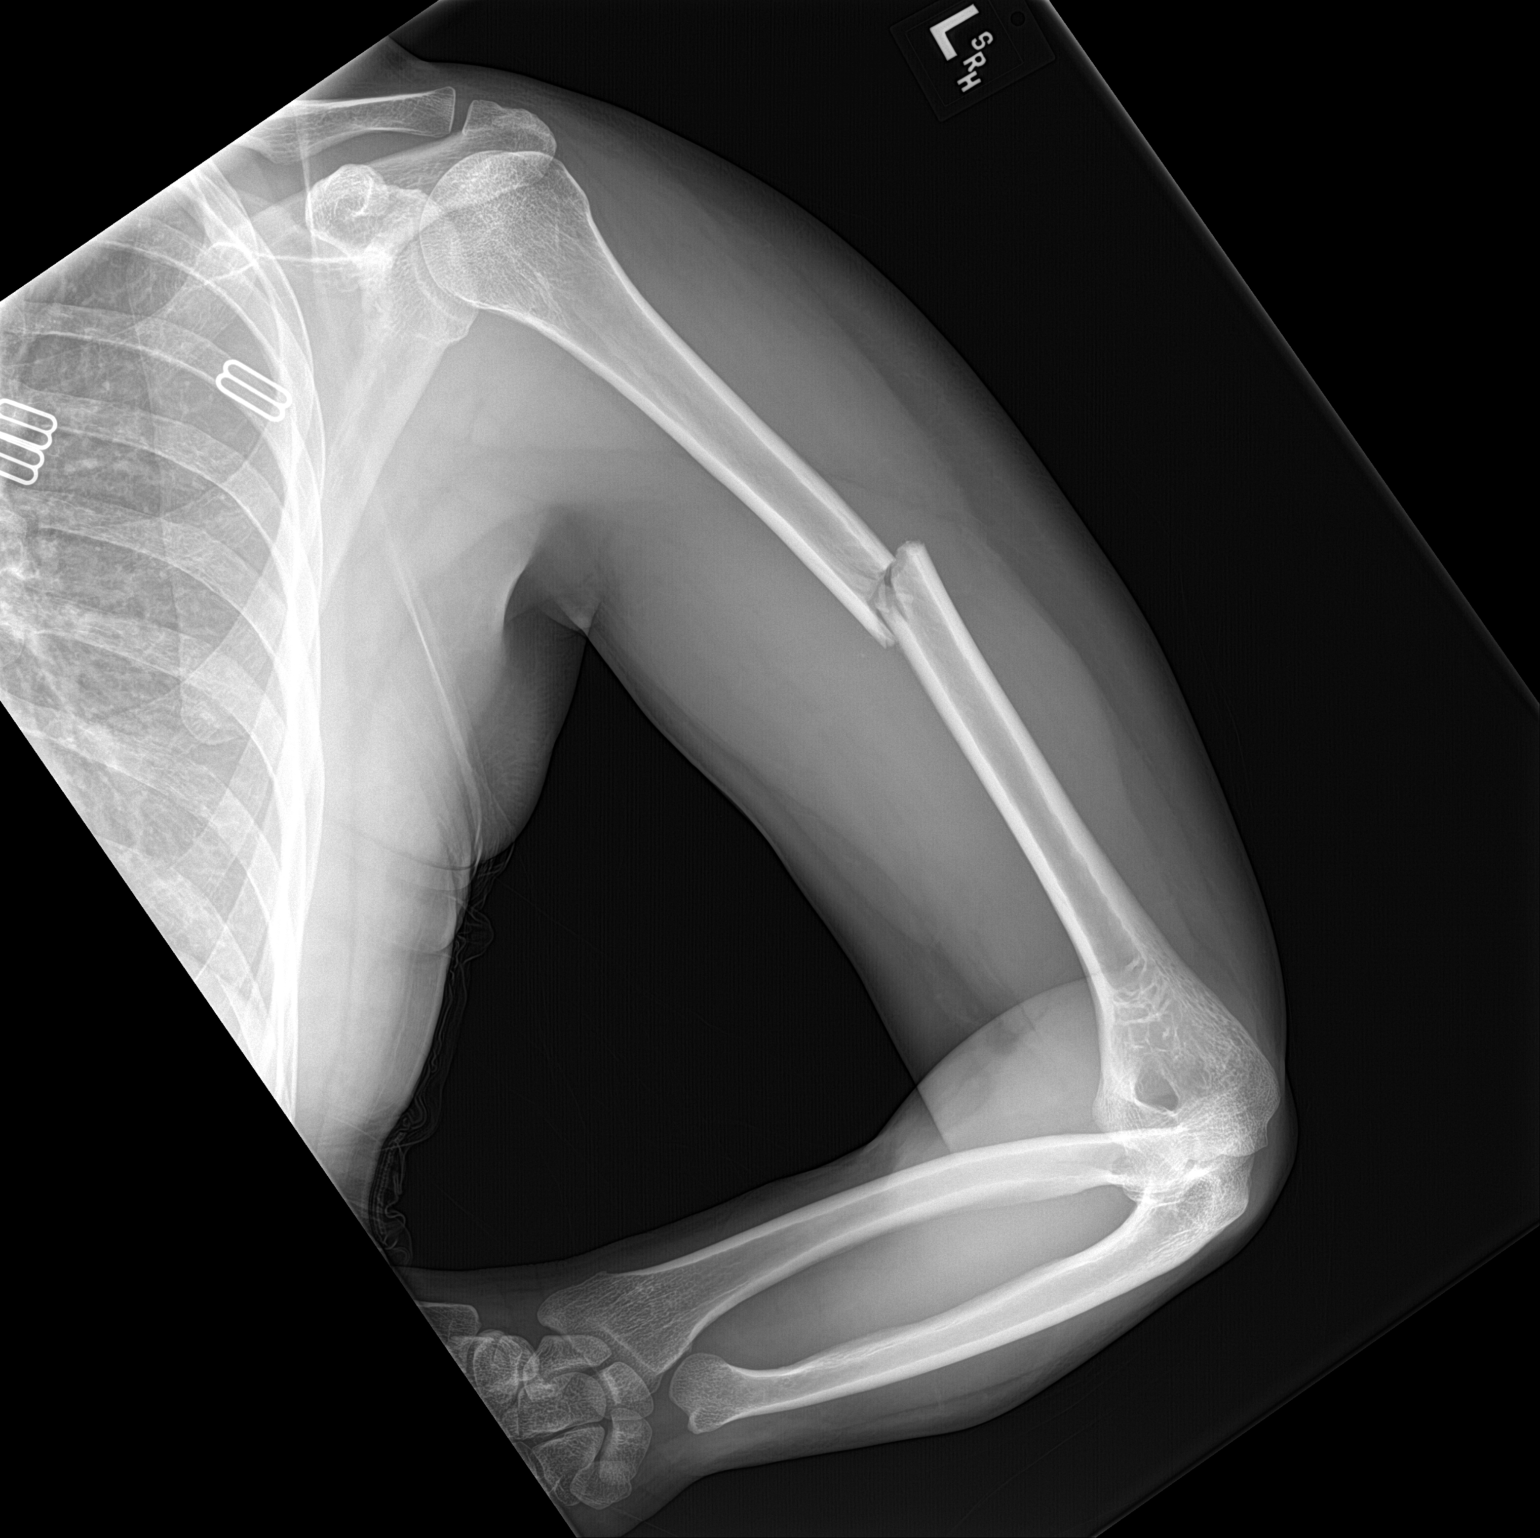

[2 of 2 positions shown; findings below may reference images not displayed]

FINDINGS: Displaced oblique fracture of the mid LEFT humeral diaphysis.

Minimal apex posterior angulation.

Shoulder and elbow joint alignments grossly normal for humeral exam.

Visualized LEFT ribs intact.
IMPRESSION: Angulated displaced mid diaphyseal fracture LEFT humerus.

## 2017-12-29 ENCOUNTER — Other Ambulatory Visit: Payer: Self-pay | Admitting: Family Medicine

## 2017-12-29 ENCOUNTER — Other Ambulatory Visit: Payer: Self-pay | Admitting: General Surgery

## 2017-12-29 DIAGNOSIS — Z1231 Encounter for screening mammogram for malignant neoplasm of breast: Secondary | ICD-10-CM

## 2018-01-12 ENCOUNTER — Ambulatory Visit
Admission: RE | Admit: 2018-01-12 | Discharge: 2018-01-12 | Disposition: A | Discharge status: Home / Self Care | Payer: BC Managed Care – PPO | Source: Ambulatory Visit | Attending: Family Medicine | Admitting: Family Medicine

## 2018-01-12 DIAGNOSIS — Z1231 Encounter for screening mammogram for malignant neoplasm of breast: Secondary | ICD-10-CM

## 2018-01-29 ENCOUNTER — Telehealth: Payer: Self-pay | Admitting: Genetics

## 2018-01-29 ENCOUNTER — Encounter: Payer: Self-pay | Admitting: Genetics

## 2018-01-29 NOTE — Telephone Encounter (Signed)
Referring provider Carlean Jews, CNM from [facility].  Pt has been scheduled to see Darral Dash for genetic counseling on 6/20 at 2pm. Pt is aware to arrive 30 minutes early. Letter mailed and faxed to the referring.

## 2018-03-12 ENCOUNTER — Inpatient Hospital Stay: Payer: BC Managed Care – PPO

## 2018-03-12 ENCOUNTER — Inpatient Hospital Stay: Payer: BC Managed Care – PPO | Attending: Genetic Counselor | Admitting: Genetics

## 2018-03-12 DIAGNOSIS — Z806 Family history of leukemia: Secondary | ICD-10-CM | POA: Diagnosis not present

## 2018-03-12 DIAGNOSIS — Z8042 Family history of malignant neoplasm of prostate: Secondary | ICD-10-CM

## 2018-03-12 DIAGNOSIS — Z803 Family history of malignant neoplasm of breast: Secondary | ICD-10-CM | POA: Diagnosis not present

## 2018-03-13 ENCOUNTER — Encounter: Payer: Self-pay | Admitting: Genetics

## 2018-03-13 DIAGNOSIS — Z803 Family history of malignant neoplasm of breast: Secondary | ICD-10-CM | POA: Insufficient documentation

## 2018-03-13 DIAGNOSIS — Z806 Family history of leukemia: Secondary | ICD-10-CM | POA: Insufficient documentation

## 2018-03-13 DIAGNOSIS — Z8042 Family history of malignant neoplasm of prostate: Secondary | ICD-10-CM | POA: Insufficient documentation

## 2018-03-13 NOTE — Progress Notes (Signed)
REFERRING PROVIDER: Darliss Rodriguez, [place], [place] [address]  PRIMARY PROVIDER:  Carol Ada, MD  PRIMARY REASON FOR VISIT:  1. Family history of prostate cancer   2. Family history of breast cancer   3. Family history of leukemia    HISTORY OF PRESENT ILLNESS:   Carol Rodriguez, a 47 y.o. female, was seen for a [facility] cancer genetics consultation at the request of Dr. Claudette Rodriguez due to a family history of cancer and questions about her 48 and Me genetic test result.  Carol Rodriguez presents to clinic today to discuss the possibility of a hereditary predisposition to cancer, genetic testing, and to further clarify her future cancer risks, as well as potential cancer risks for family members.   Carol Rodriguez is a 47 y.o. female with no personal history of cancer.  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    HORMONAL RISK FACTORS:  Menarche was at age 73.  First live birth at age 89.  OCP use for approximately 15-20 years.  Ovaries intact: yes.  Hysterectomy: no.  Menopausal status: premenopausal.  HRT use: reports she just stopped using progesterone.  She had used it for only 3 cycles.  She used it a couple years ago for 1 year.  Colonoscopy: no; not examined. Mammogram within the last year: yes. Number of breast biopsies: 0. Density, D  Past Medical History:  Diagnosis Date  . Adrenal cortical insufficiency ([place])   . Arthritis   . Complication of anesthesia    HAS NEVER HAD ANESTHESIA  . Family history of breast cancer   . Family history of leukemia   . Family history of prostate cancer   . Rhabdomyolysis 06/21/2013  . Thyroid disorder     No past surgical history on file.  Social History   Socioeconomic History  . Marital status: Married    Spouse name: Not on file  . Number of children: Not on file  . Years of education: Not on file  . Highest education level: Not on file  Occupational History  .  Not on file  Social Needs  . Financial resource strain: Not on file  . Food insecurity:    Worry: Not on file    Inability: Not on file  . Transportation needs:    Medical: Not on file    Non-medical: Not on file  Tobacco Use  . Smoking status: Never Smoker  . Smokeless tobacco: Never Used  Substance and Sexual Activity  . Alcohol use: Yes    Comment: social  . Drug use: No  . Sexual activity: Yes  Lifestyle  . Physical activity:    Days per week: Not on file    Minutes per session: Not on file  . Stress: Not on file  Relationships  . Social connections:    Talks on phone: Not on file    Gets together: Not on file    Attends religious service: Not on file    Active member of club or organization: Not on file    Attends meetings of clubs or organizations: Not on file    Relationship status: Not on file  Other Topics Concern  . Not on file  Social History Narrative  . Not on file     FAMILY HISTORY:  We obtained a detailed, 4-generation family history.  Significant diagnoses are listed below: Family History  Problem Relation Age of Onset  . Prostate cancer Father 90       '  high gleason score' metastatic  . Prostate cancer Paternal Uncle        radiation treatment  . Breast cancer Maternal Grandmother 29  . Heart attack Maternal Grandfather 40  . Other Paternal Grandmother 12       had a 'cancerous colon polyp' very shortly before death  . Heart attack Paternal Grandfather   . Other Paternal Grandfather        might have had a prostate issue  . Leukemia Cousin    Carol Rodriguez  Has 2 daughters ages 36 and 35 with no history of cancer.  Carol Rodriguez has a 59 year-old son with no history of cancer, he has 2 children.   Carol Rodriguez father: has metastatic prostate cancer dx at 39.  She reports he has 'one of the highest gleason scores'.  She reports he is not interested in genetic testing.   He has also had skin cancer.  Paternal Aunts/Uncles: 2 paternal uncles, 1 has  a history of prostate cancer- he had radiation therapy.  Paternal cousins: no history of cancer.  Paternal grandfather: died in his 54's due to heart attack.  He might have also had a prostate issue.  Paternal grandmother:died in her 30's, she had a 'cancerous polyp in her 70's found shortly before her death'  Carol Rodriguez's mother: 61, no history of cancer. Has dense breasts.  Maternal Aunts/Uncles: 3 maternal uncles with no history of cancer.  Maternal cousins: 1 maternal cousin has leukemia, he had a bone marrow transplant.  Maternal grandfather: died at 5 due to a heart attack.  Maternal grandmother:breast cancer in her 57's, died at 86. This grandmother's mother had breast cancer age unk.   Carol Rodriguez is unaware of previous family history of genetic testing for hereditary cancer risks. Patient's ancestry DNA testing reports German/French/english ancestry. There is no reported Ashkenazi Jewish ancestry. There is no known consanguinity.  GENETIC COUNSELING ASSESSMENT: Carol Rodriguez is a 47 y.o. female with a family history which is somewhat suggestive of a Hereditary Cancer Predisposition Syndrome. We, therefore, discussed and recommended the following at today's visit.   Carol Rodriguez brought with her a report from [facility] that analyzed the raw data from her [employer] genetic test report.  The report appears to be an output of SNP's that were identified in her genetic testing.  Several 'yellow' SNPs were reported in [facility].  We discussed that SNP's and variants require in depth analysis and interpretation in order to determine the clinical significance.  This output does not necessarily mean she has pathogenic mutations in the BRCA1 genes- more analysis is needed.   We would sent genetic testing to a clinical laboratory where more through sequencing and analysis can be performed by experts in order to fully understand the meaning of any variants.   DISCUSSION: We reviewed the characteristics,  features and inheritance patterns of hereditary cancer syndromes. We also discussed genetic testing, including the appropriate family members to test, the process of testing, insurance coverage and turn-around-time for results. We discussed the implications of a negative, positive and/or variant of uncertain significant result. We recommended Ms. Carl pursue genetic testing for the Common Hereditary Cancers gene panel + Leukemia/Myelodysplastic syndrome panel.   The Common Hereditary Cancer Panel offered by [facility] includes sequencing and/or deletion duplication testing of the following 47 genes: APC, ATM, AXIN2, BARD1, BMPR1A, BRCA1, BRCA2, BRIP1, CDH1, CDKN2A (p14ARF), CDKN2A (p16INK4a), CKD4, CHEK2, CTNNA1, DICER1, EPCAM (Deletion/duplication testing only), GREM1 (promoter region deletion/duplication testing only), KIT, MEN1, MLH1, MSH2,  MSH3, MSH6, MUTYH, NBN, NF1, NHTL1, PALB2, PDGFRA, PMS2, POLD1, POLE, PTEN, RAD50, RAD51C, RAD51D, SDHB, SDHC, SDHD, SMAD4, SMARCA4. STK11, TP53, TSC1, TSC2, and VHL.  The following genes were evaluated for sequence changes only: SDHA and HOXB13 c.251G>A variant only.  [facility] Myelodysplastic Syndrome/Leukemia panel: ATM BLM CEBPA EPCAM GATA2 HRAS MLH1 MSH2 MSH6 NBN NF1 PMS2 RUNX1 TERC TERT TP53  We discussed that only 5-10% of cancers are associated with a Hereditary cancer predisposition syndrome.  One of the most common hereditary cancer syndromes that increases breast cancer risk is called Hereditary Breast and Ovarian Cancer (HBOC) syndrome.  This syndrome is caused by mutations in the BRCA1 and BRCA2 genes.  This syndrome increases an individual's lifetime risk to develop breast, ovarian, pancreatic, and other types of cancer.  There are also many other cancer predisposition syndromes caused by mutations in several other genes.  We discussed that if she is found to have a mutation in one of these genes, it may impact future medical management recommendations such  as increased cancer screenings and consideration of risk reducing surgeries.  A positive result could also have implications for the patient's family members.  A Negative result would mean we were unable to identify a hereditary predisposition to cancer , but does not rule out the possibility of a hereditary cancer risk.  There could be mutations that are undetectable by current technology, or in genes not yet tested or identified to increase cancer risk.    We discussed the potential to find a Variant of Uncertain Significance or VUS.  These are variants that have not yet been identified as pathogenic or benign, and it is unknown if this variant is associated with increased cancer risk or if this is a normal finding.  Most VUS's are reclassified to benign or likely benign.   It should not be used to make medical management decisions. With time, we suspect the lab will determine the significance of any VUS's identified if any.   Based on Carol Rodriguez's family history of cancer, she meets NCCN medical criteria for genetic testing. Despite that she meets criteria, she may still have an out of pocket cost and her insurance may or may not cover her testing.   The laboratory can provide her with an estimate of her OOP cost.   Based on the patient's personal and family history, the statistical model (Tyrer Cusik)   Was used to estimate her risk of developing breast cancer. This estimates her lifetime risk of developing breast cancer to be approximately 21.4-24.1%. Tyrer Cusik does not have the ability to incorporate progesterone only hormone therapy, so this range indicates no HRT use and HRT use combined. This estimate is also calculated in the setting of negative genetic test results, a positive result may significantly impact this risk assessment.  Her breast density appears to be one of the most significant factors impacting her estimated risk.  The patient's lifetime breast cancer risk is a preliminary  estimate based on available information using one of several models endorsed by the [place] (ACS). The ACS recommends consideration of breast MRI screening as an adjunct to mammography for patients at high risk (defined as 20% or greater lifetime risk). A more detailed breast cancer risk assessment can be considered, if clinically indicated.   Carol Rodriguez has been determined to be at high risk for breast cancer.  Therefore, we recommend that annual screening with mammography and breast MRI begin at age 70, or 10 years prior to the  age of breast cancer diagnosis in a relative (whichever is earlier).  We discussed that Carol Rodriguez should discuss her individual situation with her referring physician and determine a breast cancer screening plan with which they are both comfortable.       We discussed that some people do not want to undergo genetic testing due to fear of genetic discrimination.  A federal law called the Genetic Information Non-Discrimination Act (GINA) of 2008 helps protect individuals against genetic discrimination based on their genetic test results.  It impacts both health insurance and employment.  For health insurance, it protects against increased premiums, being kicked off insurance or being forced to take a test in order to be insured.  For employment it protects against hiring, firing and promoting decisions based on genetic test results.  Health status due to a cancer diagnosis is not protected under GINA.  This law does not protect life insurance, disability insurance, or other types of insurance.   PLAN: After considering the risks, benefits, and limitations, Carol Rodriguez  provided informed consent to pursue genetic testing and the blood sample was sent to [facility] for analysis of the Common Hereditary Cancers Panel. Results should be available within approximately 2-3 weeks' time, at which point they will be disclosed by telephone to Carol Rodriguez, as will  any additional recommendations warranted by these results. Carol Rodriguez will receive a summary of her genetic counseling visit and a copy of her results once available. This information will also be available in Epic. We encouraged Carol Rodriguez to remain in contact with cancer genetics annually so that we can continuously update the family history and inform her of any changes in cancer genetics and testing that may be of benefit for her family. Carol Rodriguez questions were answered to her satisfaction today. Our contact information was provided should additional questions or concerns arise.  Based on Carol Rodriguez's family history, we recommended her father with metastatic prostate cancer or any paternal relatives also, have genetic counseling and testing. Carol Rodriguez will let us know if we can be of any assistance in coordinating genetic counseling and/or testing for this family member. She reports her father would not be interested in genetic testing.   Lastly, we encouraged Ms. Warburton to remain in contact with cancer genetics annually so that we can continuously update the family history and inform her of any changes in cancer genetics and testing that may be of benefit for this family.   Ms.  Drone questions were answered to her satisfaction today. Our contact information was provided should additional questions or concerns arise. Thank you for the referral and allowing [place] to share in the care of your patient.   Tana Felts, MS, [facility]  Certified Genetic Counselor [email or web address] phone: [phone]  The patient was seen for a total of 40 minutes in face-to-face genetic counseling.   This patient was discussed with Drs. Magrinat, Lindi Adie and/or Burr Medico who agrees with the above.

## 2018-03-27 ENCOUNTER — Telehealth: Payer: Self-pay | Admitting: Genetics

## 2018-03-30 NOTE — Telephone Encounter (Signed)
Revealed negative genetic testing.   This normal result is reassuring and indicates that it is unlikely Carol Rodriguez' has a hereditary predisposition to cancer.  However, genetic testing is not perfect, and cannot definitively rule out a hereditary cause.  It will be important for her to keep in contact with genetics to learn if any additional testing may be needed in the future.     We discussed her Tyrer Cusik score was just above 20% (takes into account hormone history, family history, breast density/bx history).  We therefore recommended discussing increased breast screening with her ob/referring provider (mammogram and annual MRI).  This risk estimate will change over time.  And in the future, may be lower than 20% and increased screening not indicated.

## 2018-04-17 ENCOUNTER — Encounter: Payer: Self-pay | Admitting: Genetics

## 2018-04-17 ENCOUNTER — Ambulatory Visit: Payer: Self-pay | Admitting: Genetics

## 2018-04-17 DIAGNOSIS — Z803 Family history of malignant neoplasm of breast: Secondary | ICD-10-CM

## 2018-04-17 DIAGNOSIS — Z8042 Family history of malignant neoplasm of prostate: Secondary | ICD-10-CM

## 2018-04-17 DIAGNOSIS — Z1379 Encounter for other screening for genetic and chromosomal anomalies: Secondary | ICD-10-CM

## 2018-04-17 DIAGNOSIS — Z806 Family history of leukemia: Secondary | ICD-10-CM

## 2018-04-17 NOTE — Progress Notes (Signed)
HPI:  Ms. Laton was previously seen in the [facility] on 03/12/2018 due to a family history of cancer and to answer questions about her 89 and Me genetic test result. Please refer to our prior cancer genetics clinic note for more information regarding Ms. Schroader's medical, social and family histories, and our assessment and recommendations, at the time. Ms. Canny recent genetic test results were disclosed to her, as well as recommendations warranted by these results. These results and recommendations are discussed in more detail below.  Ms. Balow is a 47 y.o. female with no personal history of cancer.  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   FAMILY HISTORY:  We obtained a detailed, 4-generation family history.  Significant diagnoses are listed below: Family History  Problem Relation Age of Onset  . Prostate cancer Father 15       'high gleason score' metastatic  . Prostate cancer Paternal Uncle        radiation treatment  . Breast cancer Maternal Grandmother 62  . Heart attack Maternal Grandfather 40  . Other Paternal Grandmother 2       had a 'cancerous colon polyp' very shortly before death  . Heart attack Paternal Grandfather   . Other Paternal Grandfather        might have had a prostate issue  . Leukemia Cousin    Ms. Tryon  Has 2 daughters ages 50 and 50 with no history of cancer.  Ms. Farina has a 56 year-old son with no history of cancer, he has 2 children.   Ms. Casanas father: has metastatic prostate cancer dx at 59.  She reports he has 'one of the highest gleason scores'.  She reports he is not interested in genetic testing.   He has also had skin cancer.  Paternal Aunts/Uncles: 2 paternal uncles, 1 has a history of prostate cancer- he had radiation therapy.  Paternal cousins: no history of cancer.  Paternal grandfather: died in his 22's due to heart attack.  He might have also had a  prostate issue.  Paternal grandmother:died in her 57's, she had a 'cancerous polyp in her 49's found shortly before her death'  Ms. Stogdill's mother: 68, no history of cancer. Has dense breasts.  Maternal Aunts/Uncles: 3 maternal uncles with no history of cancer.  Maternal cousins: 1 maternal cousin has leukemia, he had a bone marrow transplant.  Maternal grandfather: died at 35 due to a heart attack.  Maternal grandmother:breast cancer in her 52's, died at 20. This grandmother's mother had breast cancer age unk.   Ms. Crowell is unaware of previous family history of genetic testing for hereditary cancer risks. Patient's ancestry DNA testing reports German/French/english ancestry. There is no reported Ashkenazi Jewish ancestry. There is no known consanguinity.  GENETIC TEST RESULTS: Genetic testing performed through [facility] Common Hereditary Cancer Panel + Myelodysplastic/Leukemia Panel reported out on 03/27/2018 showed no pathogenic mutations. The following genes were evaluated for sequence changes and exonic deletions/duplications: APC, ATM, AXIN2, BARD1, BLM, BMPR1A, BRCA1, BRCA2, BRIP1, CDH1, CDK4, CDKN2A (p14ARF), CDKN2A (p16INK4a), CEBPA, CHEK2, CTNNA1, DICER1, EPCAM*, GATA2, GREM1*, HRAS, KIT, MEN1, MLH1, MSH2, MSH3, MSH6, MUTYH, NBN, NF1, PALB2, PDGFRA, PMS2, POLD1, POLE, PTEN, RAD50, RAD51C, RAD51D, RUNX1, SDHB, SDHC, SDHD, SMAD4, SMARCA4, STK11, TERC, TERT, TP53, TSC1, TSC2, VHL The following genes were evaluated for sequence changes only: HOXB13*, NTHL1*, SDHA.  The test report will be scanned into EPIC and will be located under the Molecular Pathology  section of the Results Review tab. A portion of the result report is included below for reference.     We discussed with Ms. Mccullars that because current genetic testing is not perfect, it is possible there may be a gene mutation in one of these genes that current testing cannot detect, but that chance is small.  We also discussed, that  there could be another gene that has not yet been discovered, or that we have not yet tested, that is responsible for the cancer diagnoses in the family. It is also possible there is a hereditary cause for the cancer in the family that Ms. Shek did not inherit and therefore was not identified in her testing.  Therefore, it is important to remain in touch with cancer genetics in the future so that we can continue to offer Ms. Wheeling the most up to date genetic testing.   ADDITIONAL GENETIC TESTING: We discussed with Ms. Allcock that there are other genes that are associated with increased cancer risk that can be analyzed. The laboratories that offer this testing look at these additional genes via a hereditary cancer gene panel. Should Ms. Suttles wish to pursue additional genetic testing, we are happy to discuss and coordinate this testing, at any time.    CANCER SCREENING RECOMMENDATIONS: Ms. Dohrmann's test result is negative (normal).  This means that we have not identified a hereditary predisposition to cancer in her at this time.     While reassuring, this does not definitively rule out a hereditary predisposition to cancer. It is still possible that there could be genetic mutations that are undetectable by current technology, or genetic mutations in genes that have not been tested or identified to increase cancer risk.  Therefore, it is recommended she continue to follow the cancer management and screening guidelines provided by her oncology and primary healthcare provider. An individual's cancer risk is not determined by genetic test results alone.  Overall cancer risk assessment includes additional factors such as personal medical history, family history, etc.  These should be used to make a personalized plan for cancer prevention and surveillance.    Based on the patient's personal and family history, the statistical model (Tyrer Cusik)   Was used to estimate her risk of developing breast cancer.  This estimates her lifetime risk of developing breast cancer to be approximately 21.4-24.1%. Tyrer Cusik does not have the ability to incorporate progesterone only hormone therapy, so this range indicates no HRT use and HRT use combined.  Her breast density appears to be one of the most significant factors impacting her estimated risk.  The patient's lifetime breast cancer risk is a preliminary estimate based on available information using one of several models endorsed by the [place] (ACS). The ACS recommends consideration of breast MRI screening as an adjunct to mammography for patients at high risk (defined as 20% or greater lifetime risk). A more detailed breast cancer risk assessment can be considered, if clinically indicated.   Ms. Conradt has been determined to be at high risk for breast cancer. Therefore, we recommend that annual screening with mammography and breast MRI begin at age 69, or 10 years prior to the age of breast cancer diagnosis in a relative (whichever is earlier). We discussed that Ms. Chargois should discuss her individual situation with her referring physician and determine a breast cancer screening plan with which they are both comfortable.       RECOMMENDATIONS FOR FAMILY MEMBERS:  Relatives in this family might  be at some increased risk of developing cancer, over the general population risk, simply due to the family history of cancer.  We recommended women in this family have a yearly mammogram beginning at age 34, or 68 years younger than the earliest onset of cancer, an annual clinical breast exam, and perform monthly breast self-exams. Women in this family should also have a gynecological exam as recommended by their primary provider. All family members should have a colonoscopy by age 18 (or as directed by their doctors).  All family members should inform their physicians about the family history of cancer so their doctors can make the most appropriate  screening recommendations for them.   It is also possible there is a hereditary cause for the cancer in Ms. Schindel's family that she did not inherit and therefore was not identified in her.  Therefore, we recommended father with metastatic prostate cancer/paternal realtives also have genetic counseling and testing. Ms. Behler will let us know if we can be of any assistance in coordinating genetic counseling and/or testing for these family members.   FOLLOW-UP: Lastly, we discussed with Ms. Smejkal that cancer genetics is a rapidly advancing field and it is possible that new genetic tests will be appropriate for her and/or her family members in the future. We encouraged her to remain in contact with cancer genetics on an annual basis so we can update her personal and family histories and let her know of advances in cancer genetics that may benefit this family.   Our contact number was provided. Ms. Lorensen questions were answered to her satisfaction, and she knows she is welcome to call us at anytime with additional questions or concerns.   Ferol Luz, MS, [place] Certified Genetic Counselor [email or web address]

## 2019-04-14 ENCOUNTER — Other Ambulatory Visit: Payer: Self-pay | Admitting: Obstetrics and Gynecology

## 2019-04-14 DIAGNOSIS — Z803 Family history of malignant neoplasm of breast: Secondary | ICD-10-CM

## 2020-10-13 ENCOUNTER — Other Ambulatory Visit: Payer: Self-pay | Admitting: Obstetrics and Gynecology

## 2020-10-13 DIAGNOSIS — N644 Mastodynia: Secondary | ICD-10-CM

## 2020-11-01 ENCOUNTER — Other Ambulatory Visit: Payer: Self-pay | Admitting: Obstetrics and Gynecology

## 2020-11-01 ENCOUNTER — Ambulatory Visit
Admission: RE | Admit: 2020-11-01 | Discharge: 2020-11-01 | Disposition: A | Discharge status: Home / Self Care | Payer: BC Managed Care – PPO | Source: Ambulatory Visit | Attending: Obstetrics and Gynecology | Admitting: Obstetrics and Gynecology

## 2020-11-01 ENCOUNTER — Other Ambulatory Visit: Payer: Self-pay

## 2020-11-01 DIAGNOSIS — N644 Mastodynia: Secondary | ICD-10-CM

## 2020-11-02 ENCOUNTER — Other Ambulatory Visit: Payer: Self-pay | Admitting: Obstetrics and Gynecology

## 2020-11-02 DIAGNOSIS — Z9189 Other specified personal risk factors, not elsewhere classified: Secondary | ICD-10-CM

## 2020-11-24 ENCOUNTER — Other Ambulatory Visit: Payer: BC Managed Care – PPO

## 2021-06-25 ENCOUNTER — Other Ambulatory Visit: Payer: BC Managed Care – PPO

## 2022-04-02 ENCOUNTER — Encounter (HOSPITAL_BASED_OUTPATIENT_CLINIC_OR_DEPARTMENT_OTHER): Payer: Self-pay | Admitting: Obstetrics and Gynecology

## 2022-04-02 ENCOUNTER — Other Ambulatory Visit: Payer: Self-pay

## 2022-04-02 NOTE — Progress Notes (Addendum)
Spoke w/ via phone for pre-op interview---pt Lab needs dos----  none per anesthsia, surgery orders requested dr lae epic ib             Lab results------ COVID test -----patient states asymptomatic no test needed Arrive at -------800 am 04-08-2022 NPO after MN NO Solid Food.  Clear liquids from MN until---700 am Med rec completed Medications to take morning of surgery -----zyrtec, flonase nasal spray prn, estrogen patch Diabetic medication -----n/a Patient instructed no nail polish to be worn day of surgery Patient instructed to bring photo id and insurance card day of surgery Patient aware to have Driver (ride ) / caregiver  sean husband   for 24 hours after surgery  Patient Special Instructions -----none Pre-Op special Istructions -----none Patient verbalized understanding of instructions that were given at this phone interview. Patient denies shortness of breath, chest pain, fever, cough at this phone interview.   See genetic testing note 10-10-2017 chart/epic

## 2022-04-03 ENCOUNTER — Other Ambulatory Visit: Payer: Self-pay | Admitting: Obstetrics and Gynecology

## 2022-04-08 ENCOUNTER — Ambulatory Visit (HOSPITAL_BASED_OUTPATIENT_CLINIC_OR_DEPARTMENT_OTHER)
Admission: RE | Admit: 2022-04-08 | Discharge: 2022-04-08 | Disposition: A | Discharge status: Home / Self Care | Payer: BC Managed Care – PPO | Attending: Obstetrics and Gynecology | Admitting: Obstetrics and Gynecology

## 2022-04-08 ENCOUNTER — Ambulatory Visit (HOSPITAL_BASED_OUTPATIENT_CLINIC_OR_DEPARTMENT_OTHER): Payer: BC Managed Care – PPO | Admitting: Anesthesiology

## 2022-04-08 ENCOUNTER — Encounter (HOSPITAL_BASED_OUTPATIENT_CLINIC_OR_DEPARTMENT_OTHER): Payer: Self-pay | Admitting: Obstetrics and Gynecology

## 2022-04-08 ENCOUNTER — Encounter (HOSPITAL_BASED_OUTPATIENT_CLINIC_OR_DEPARTMENT_OTHER)
Admission: RE | Disposition: A | Discharge status: Home / Self Care | Payer: Self-pay | Source: Home / Self Care | Attending: Obstetrics and Gynecology

## 2022-04-08 ENCOUNTER — Other Ambulatory Visit: Payer: Self-pay

## 2022-04-08 DIAGNOSIS — N950 Postmenopausal bleeding: Secondary | ICD-10-CM | POA: Insufficient documentation

## 2022-04-08 DIAGNOSIS — G4700 Insomnia, unspecified: Secondary | ICD-10-CM | POA: Insufficient documentation

## 2022-04-08 DIAGNOSIS — N858 Other specified noninflammatory disorders of uterus: Secondary | ICD-10-CM | POA: Insufficient documentation

## 2022-04-08 DIAGNOSIS — Z01818 Encounter for other preprocedural examination: Secondary | ICD-10-CM

## 2022-04-08 HISTORY — DX: Disease of blood and blood-forming organs, unspecified: D75.9

## 2022-04-08 HISTORY — DX: Sleep apnea, unspecified: G47.30

## 2022-04-08 HISTORY — DX: Postmenopausal bleeding: N95.0

## 2022-04-08 HISTORY — PX: HYSTEROSCOPY WITH D & C: SHX1775

## 2022-04-08 HISTORY — DX: Family history of ischemic heart disease and other diseases of the circulatory system: Z82.49

## 2022-04-08 LAB — BASIC METABOLIC PANEL
Anion gap: 6 (ref 5–15)
BUN: 20 mg/dL (ref 6–20)
CO2: 26 mmol/L (ref 22–32)
Calcium: 8.9 mg/dL (ref 8.9–10.3)
Chloride: 105 mmol/L (ref 98–111)
Creatinine, Ser: 0.99 mg/dL (ref 0.44–1.00)
GFR, Estimated: >60 mL/min (ref >60)
Glucose, Bld: 99 mg/dL (ref 70–99)
Potassium: 3.8 mmol/L (ref 3.5–5.1)
Sodium: 137 mmol/L (ref 135–145)

## 2022-04-08 LAB — CBC
HCT: 40.2 % (ref 36.0–46.0)
Hemoglobin: 13.8 g/dL (ref 12.0–15.0)
MCH: 33.2 pg (ref 26.0–34.0)
MCHC: 34.3 g/dL (ref 30.0–36.0)
MCV: 96.6 fL (ref 80.0–100.0)
Platelets: 172 10*3/uL (ref 150–400)
RBC: 4.16 MIL/uL (ref 3.87–5.11)
RDW: 12.4 % (ref 11.5–15.5)
WBC: 5.1 10*3/uL (ref 4.0–10.5)
nRBC: 0 % (ref 0.0–0.2)

## 2022-04-08 LAB — ABO/RH: ABO/RH(D): A POS

## 2022-04-08 LAB — TYPE AND SCREEN
ABO/RH(D): A POS
Antibody Screen: NEGATIVE

## 2022-04-08 SURGERY — DILATATION AND CURETTAGE /HYSTEROSCOPY
Anesthesia: General | Site: Vagina

## 2022-04-08 MED ORDER — FENTANYL CITRATE (PF) 100 MCG/2ML IJ SOLN
INTRAMUSCULAR | Status: AC
  Filled 2022-04-08: qty 2

## 2022-04-08 MED ORDER — ONDANSETRON HCL 4 MG/2ML IJ SOLN
INTRAMUSCULAR | Status: AC
  Filled 2022-04-08: qty 2

## 2022-04-08 MED ORDER — ONDANSETRON HCL 4 MG/2ML IJ SOLN: 4.0000 mg | Freq: Once | INTRAMUSCULAR | Status: DC | PRN

## 2022-04-08 MED ORDER — LIDOCAINE 2% (20 MG/ML) 5 ML SYRINGE
INTRAMUSCULAR | Status: DC | PRN
  Administered 2022-04-08: 80 mg via INTRAVENOUS

## 2022-04-08 MED ORDER — LIDOCAINE HCL 1 % IJ SOLN
INTRAMUSCULAR | Status: DC | PRN
  Administered 2022-04-08: 10 mL

## 2022-04-08 MED ORDER — MIDAZOLAM HCL 5 MG/5ML IJ SOLN
INTRAMUSCULAR | Status: DC | PRN
  Administered 2022-04-08: 2 mg via INTRAVENOUS

## 2022-04-08 MED ORDER — GLYCOPYRROLATE PF 0.2 MG/ML IJ SOSY
PREFILLED_SYRINGE | INTRAMUSCULAR | Status: AC
  Filled 2022-04-08: qty 1

## 2022-04-08 MED ORDER — KETOROLAC TROMETHAMINE 30 MG/ML IJ SOLN
INTRAMUSCULAR | Status: DC | PRN
  Administered 2022-04-08: 30 mg via INTRAVENOUS

## 2022-04-08 MED ORDER — POVIDONE-IODINE 10 % EX SWAB: 2.0000 | Freq: Once | CUTANEOUS | Status: DC

## 2022-04-08 MED ORDER — LACTATED RINGERS IV SOLN: INTRAVENOUS | Status: DC

## 2022-04-08 MED ORDER — PROPOFOL 10 MG/ML IV BOLUS
INTRAVENOUS | Status: DC | PRN
  Administered 2022-04-08: 150 mg via INTRAVENOUS
  Administered 2022-04-08: 50 mg via INTRAVENOUS

## 2022-04-08 MED ORDER — MIDAZOLAM HCL 2 MG/2ML IJ SOLN
INTRAMUSCULAR | Status: AC
  Filled 2022-04-08: qty 2

## 2022-04-08 MED ORDER — PROPOFOL 10 MG/ML IV BOLUS
INTRAVENOUS | Status: AC
  Filled 2022-04-08: qty 20

## 2022-04-08 MED ORDER — SODIUM CHLORIDE 0.9 % IR SOLN
Status: DC | PRN
  Administered 2022-04-08: 3000 mL

## 2022-04-08 MED ORDER — LIDOCAINE HCL (PF) 2 % IJ SOLN
INTRAMUSCULAR | Status: AC
  Filled 2022-04-08: qty 5

## 2022-04-08 MED ORDER — ONDANSETRON HCL 4 MG/2ML IJ SOLN
INTRAMUSCULAR | Status: DC | PRN
  Administered 2022-04-08: 4 mg via INTRAVENOUS

## 2022-04-08 MED ORDER — DEXAMETHASONE SODIUM PHOSPHATE 10 MG/ML IJ SOLN
INTRAMUSCULAR | Status: DC | PRN
  Administered 2022-04-08: 10 mg via INTRAVENOUS

## 2022-04-08 MED ORDER — FENTANYL CITRATE (PF) 100 MCG/2ML IJ SOLN
INTRAMUSCULAR | Status: DC | PRN
  Administered 2022-04-08 (×2): 50 ug via INTRAVENOUS

## 2022-04-08 MED ORDER — FENTANYL CITRATE (PF) 100 MCG/2ML IJ SOLN
25.0000 ug | INTRAMUSCULAR | Status: DC | PRN
  Administered 2022-04-08: 25 ug via INTRAVENOUS

## 2022-04-08 MED ORDER — DEXAMETHASONE SODIUM PHOSPHATE 10 MG/ML IJ SOLN
INTRAMUSCULAR | Status: AC
  Filled 2022-04-08: qty 1

## 2022-04-08 MED ORDER — ACETAMINOPHEN 500 MG PO TABS
1000.0000 mg | ORAL_TABLET | Freq: Once | ORAL | Status: AC
  Administered 2022-04-08: 1000 mg via ORAL

## 2022-04-08 MED ORDER — KETOROLAC TROMETHAMINE 30 MG/ML IJ SOLN
INTRAMUSCULAR | Status: AC
  Filled 2022-04-08: qty 1

## 2022-04-08 MED ORDER — ACETAMINOPHEN 500 MG PO TABS
ORAL_TABLET | ORAL | Status: AC
  Filled 2022-04-08: qty 2

## 2022-04-08 MED ORDER — GLYCOPYRROLATE PF 0.2 MG/ML IJ SOSY
PREFILLED_SYRINGE | INTRAMUSCULAR | Status: DC | PRN
  Administered 2022-04-08: .2 mg via INTRAVENOUS

## 2022-04-08 SURGICAL SUPPLY — 15 items
GLOVE BIOGEL PI IND STRL 7.0 (GLOVE) ×2 IMPLANT
GLOVE BIOGEL PI INDICATOR 7.0 (GLOVE) ×5
GLOVE ECLIPSE 6.5 STRL STRAW (GLOVE) ×2 IMPLANT
GOWN STRL REUS W/TWL LRG LVL3 (GOWN DISPOSABLE) ×3 IMPLANT
GOWN STRL REUS W/TWL XL LVL3 (GOWN DISPOSABLE) ×2 IMPLANT
IV NS IRRIG 3000ML ARTHROMATIC (IV SOLUTION) ×1 IMPLANT
KIT PROCEDURE FLUENT (KITS) ×2 IMPLANT
KIT TURNOVER CYSTO (KITS) ×2 IMPLANT
PACK VAGINAL MINOR WOMEN LF (CUSTOM PROCEDURE TRAY) ×2 IMPLANT
PAD OB MATERNITY 4.3X12.25 (PERSONAL CARE ITEMS) ×2 IMPLANT
SEAL ROD LENS SCOPE MYOSURE (ABLATOR) ×2 IMPLANT
SOL PREP POV-IOD 4OZ 10% (MISCELLANEOUS) ×1 IMPLANT
SPIKE FLUID TRANSFER (MISCELLANEOUS) ×1 IMPLANT
TOWEL OR 17X26 10 PK STRL BLUE (TOWEL DISPOSABLE) ×3 IMPLANT
UNDERPAD 30X36 HEAVY ABSORB (UNDERPADS AND DIAPERS) ×2 IMPLANT

## 2022-04-08 NOTE — Anesthesia Preprocedure Evaluation (Signed)
Anesthesia Evaluation  Patient identified by MRN, date of birth, ID band Patient awake    Reviewed: Allergy & Precautions, NPO status , Patient's Chart, lab work & pertinent test results  Airway Mallampati: II  TM Distance: >3 FB Neck ROM: Full    Dental  (+) Teeth Intact, Dental Advisory Given   Pulmonary sleep apnea (oral appliance) ,    Pulmonary exam normal breath sounds clear to auscultation       Cardiovascular negative cardio ROS Normal cardiovascular exam Rhythm:Regular Rate:Normal     Neuro/Psych negative neurological ROS     GI/Hepatic negative GI ROS, Neg liver ROS,   Endo/Other  negative endocrine ROS  Renal/GU negative Renal ROS     Musculoskeletal  (+) Arthritis ,   Abdominal   Peds  Hematology negative hematology ROS (+)   Anesthesia Other Findings Day of surgery medications reviewed with the patient.  Reproductive/Obstetrics Postmenopausal Bleeding                             Anesthesia Physical Anesthesia Plan  ASA: 2  Anesthesia Plan: General   Post-op Pain Management: Tylenol PO (pre-op)*   Induction: Intravenous  PONV Risk Score and Plan: 4 or greater and Midazolam, Dexamethasone and Ondansetron  Airway Management Planned: LMA  Additional Equipment:   Intra-op Plan:   Post-operative Plan: Extubation in OR  Informed Consent: I have reviewed the patients History and Physical, chart, labs and discussed the procedure including the risks, benefits and alternatives for the proposed anesthesia with the patient or authorized representative who has indicated his/her understanding and acceptance.     Dental advisory given  Plan Discussed with: CRNA  Anesthesia Plan Comments:         Anesthesia Quick Evaluation

## 2022-04-08 NOTE — Transfer of Care (Signed)
Immediate Anesthesia Transfer of Care Note  Patient: Carol Rodriguez  Procedure(s) Performed: DILATATION AND CURETTAGE /HYSTEROSCOPY (Vagina )  Patient Location: PACU  Anesthesia Type:General  Level of Consciousness: awake, alert , oriented and patient cooperative  Airway & Oxygen Therapy: Patient Spontanous Breathing and Patient connected to face mask oxygen  Post-op Assessment: Report given to RN and Post -op Vital signs reviewed and stable  Post vital signs: Reviewed and stable  Last Vitals:  Vitals Value Taken Time  BP 115/72 04/08/22 1145  Temp    Pulse 70 04/08/22 1146  Resp 10 04/08/22 1146  SpO2 100 % 04/08/22 1146  Vitals shown include unvalidated device data.  Last Pain:  Vitals:   04/08/22 0850  TempSrc: Oral  PainSc: 0-No pain      Patients Stated Pain Goal: 5 (04/08/22 0850)  Complications: No notable events documented.

## 2022-04-08 NOTE — Anesthesia Postprocedure Evaluation (Signed)
Anesthesia Post Note  Patient: Carol Rodriguez  Procedure(s) Performed: DILATATION AND CURETTAGE /HYSTEROSCOPY (Vagina )     Patient location during evaluation: PACU Anesthesia Type: General Level of consciousness: awake and alert Pain management: pain level controlled Vital Signs Assessment: post-procedure vital signs reviewed and stable Respiratory status: spontaneous breathing, nonlabored ventilation, respiratory function stable and patient connected to nasal cannula oxygen Cardiovascular status: blood pressure returned to baseline and stable Postop Assessment: no apparent nausea or vomiting Anesthetic complications: no   No notable events documented.  Last Vitals:  Vitals:   04/08/22 1230 04/08/22 1301  BP: 126/78 132/82  Pulse: (!) 58 (!) 56  Resp: 16 15  Temp:  36.4 C  SpO2: 100% 100%    Last Pain:  Vitals:   04/08/22 1301  TempSrc:   PainSc: 2                  Collene Schlichter

## 2022-04-08 NOTE — Discharge Instructions (Addendum)
No acetaminophen/Tylenol until after 3:00pm today if needed for pain.   No ibuprofen, Advil, Aleve, Motrin, ketorolac, meloxicam, naproxen, or other NSAIDS until after 5:30pm today if needed for pain.     DISCHARGE INSTRUCTIONS: HYSTEROSCOPY / ENDOMETRIAL ABLATION The following instructions have been prepared to help you care for yourself upon your return home.  Personal hygiene:  Use sanitary pads for vaginal drainage, not tampons.  Shower the day after your procedure.  NO tub baths, pools or Jacuzzis for 2-3 weeks.  Wipe front to back after using the bathroom.  Activity and limitations:  Do NOT drive or operate any equipment for 24 hours. The effects of anesthesia are still present and drowsiness may result.  Do NOT rest in bed all day.  Walking is encouraged.  Walk up and down stairs slowly.  You may resume your normal activity in one to two days or as indicated by your physician. Sexual activity: NO intercourse for at least 2 weeks after the procedure, or as indicated by your Doctor.  Diet: Eat a light meal as desired this evening. You may resume your usual diet tomorrow.  Return to Work: You may resume your work activities in one to two days or as indicated by Therapist, sports.  What to expect after your surgery: Expect to have vaginal bleeding/discharge for 2-3 days and spotting for up to 10 days. It is not unusual to have soreness for up to 1-2 weeks. You may have a slight burning sensation when you urinate for the first day. Mild cramps may continue for a couple of days. You may have a regular period in 2-6 weeks.  Call your doctor for any of the following:  Excessive vaginal bleeding or clotting, saturating and changing one pad every hour.  Inability to urinate 6 hours after discharge from hospital.  Pain not relieved by pain medication.  Fever of 100.4 F or greater.  Unusual vaginal discharge or odor.       Post Anesthesia Home Care  Instructions  Activity: Get plenty of rest for the remainder of the day. A responsible individual must stay with you for 24 hours following the procedure.  For the next 24 hours, DO NOT: -Drive a car -Advertising copywriter -Drink alcoholic beverages -Take any medication unless instructed by your physician -Make any legal decisions or sign important papers.  Meals: Start with liquid foods such as gelatin or soup. Progress to regular foods as tolerated. Avoid greasy, spicy, heavy foods. If nausea and/or vomiting occur, drink only clear liquids until the nausea and/or vomiting subsides. Call your physician if vomiting continues.  Special Instructions/Symptoms: Your throat may feel dry or sore from the anesthesia or the breathing tube placed in your throat during surgery. If this causes discomfort, gargle with warm salt water. The discomfort should disappear within 24 hours.  If you had a scopolamine patch placed behind your ear for the management of post- operative nausea and/or vomiting:  1. The medication in the patch is effective for 72 hours, after which it should be removed.  Wrap patch in a tissue and discard in the trash. Wash hands thoroughly with soap and water. 2. You may remove the patch earlier than 72 hours if you experience unpleasant side effects which may include dry mouth, dizziness or visual disturbances. 3. Avoid touching the patch. Wash your hands with soap and water after contact with the patch.

## 2022-04-08 NOTE — Op Note (Signed)
Eusebio Me. Dec 16, 1970 [id number]  Operative Note  PROCEDURE: diagnostic hysteroscopy, dilation and curettage   PRE-OPERATIVE DIAGNOSIS: PMB  POST-OPERATIVE DIAGNOSIS: PMB  SURGEON: Dr. Clance Boll, DO  ASSISTANT: N/A  FINDINGS: normal appearing ectocervix without lesions, normal appearing cervical canal and normal appearing uterine cavity with bilateral tubal ostia visualized, atrophic endometrium without lesions  SPECIMENS: endometrial curettings  EBL: minimal  FLUIDS: Per Anesthesia  COMPLICATIONS: None  PROCEDURE IN DETAIL:   After the patient was appropriately consented in the holding area, she was taken to the operating room where general anesthesia was administered without complications. The patient was placed in the dorsal lithotomy position. The patient was prepped and draped in the usual sterile fashion. The patient had voided just prior to OR so bladder catheterization was deferred. An appropriate time out was performed that verified the correct patient, procedure, and surgical team.   A sterile speculum was inserted into the vagina and the cervix was visualized. A single tooth tenaculum was used to grasp the anterior lip of the cervix. A paracervical block was obtained using 1% plain lidocaine injecting a total of 10 cc divided between the 4 and 8 o'clock positions. The cervix was noted to be stenotic and an os finder used to assist in dilation. The cervix was sequentially dilated up to a size 19 french. The hysteroscope was introduced through the cervix and advanced to the uterine fundus. The uterine cavity distension was obtained without difficulty and the findings were noted as described above. Since there were no obvious lesions to target, MyoSure was not used and the hysteroscopy was removed to perform a thorough curetting. A thorough sharp curetting was performed and specimens collected on a Telfa. All specimens were sent for final pathology. The tenaculum was  removed and excellent hemostasis was appreciated. Speculum was removed. Patient tolerated the procedure well and was taken to the recovery room in stable condition. All instrument and lap counts were correct.  Artez Regis A Keltie Labell 04/08/22 11:49 AM

## 2022-04-08 NOTE — Anesthesia Procedure Notes (Signed)
Procedure Name: LMA Insertion Date/Time: 04/08/2022 10:06 AM  Performed by: Bishop Limbo, CRNAPre-anesthesia Checklist: Patient identified, Emergency Drugs available, Suction available and Patient being monitored Patient Re-evaluated:Patient Re-evaluated prior to induction Oxygen Delivery Method: Circle System Utilized Preoxygenation: Pre-oxygenation with 100% oxygen Induction Type: IV induction Ventilation: Mask ventilation without difficulty LMA: LMA inserted LMA Size: 3.0 Number of attempts: 2 (Tried LMA 4, unable to seat) Placement Confirmation: positive ETCO2 Tube secured with: Tape Dental Injury: Teeth and Oropharynx as per pre-operative assessment

## 2022-04-08 NOTE — H&P (Signed)
Carol Rodriguez is an 51 y.o. female presenting for scheduled surgery with hysteroscopy D&C for PMB.  Patient seen for PMB episodes x2 in the office over the past 2 months 03/05/22 TVUS showed Endometrium/endocervical canal with multiple punctate echogenic foci. The largest seen fundally within the endometrium measures 2.21mm. ES 3.54mm 03/28/22 EBx showed fragmented proliferative endometrium with rare squamous morules (notes per pathology states can be present in normal endometrium but can also be present in glandular hyperplasia and recommends clinical correlation)  Has been on HRT E2 patch 0.75 mg semi- weekly and Prometrium 100 nightly  Patient seen for preop visit in the office on 7/14 where she reported some vaginal itching that had not responded to Nystatin use. She was found to have mild BV and yeast present on wet prep and subsequently treated with Metrogel and Diflucan.  PMH significant for insomnia for which she takes Ambien/Vistaril PRN, anxiety on Xanax PRN, and GERD no daily meds PSH: none  Partner with vasectomy for contraception Pap 04/2020 WNL    Menstrual History:  Patient's last menstrual period was 04/23/2020.    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    see lov  10-10-2017 dr Amie [place] md note in epic, no treatment needed   PMB (postmenopausal bleeding)    Rhabdomyolysis 06/21/2013   arms swelled and acute kidney injury issue resolved   Sleep apnea    mild osa, using oral appliance for teeth grinding    Past Surgical History:  Procedure Laterality Date   colonscopy     2021 or 2022    Family History  Problem Relation Age of Onset   Prostate cancer Father 86       'high gleason score' metastatic   Prostate cancer  Paternal Uncle        radiation treatment   Breast cancer Maternal Grandmother 65   Heart attack Maternal Grandfather 77   Other Paternal Grandmother 17       had a 'cancerous colon polyp' very shortly before death   Heart attack Paternal Grandfather    Other Paternal Grandfather        might have had a prostate issue   Leukemia Cousin     Social History:  reports that she has never smoked. She has never used smokeless tobacco. She reports current alcohol use. She reports that she does not use drugs.  Allergies: No Known Allergies  Medications Prior to Admission  Medication Sig Dispense Refill Last Dose   ALPRAZolam (XANAX) 0.5 MG tablet Take 0.25 mg by mouth at bedtime as needed for anxiety.   Past Month   cetirizine (ZYRTEC) 10 MG tablet Take 10 mg by mouth daily.   Past Month   fluconazole (DIFLUCAN) 100 MG tablet Take 100 mg by mouth once.   04/05/2022   fluticasone (FLONASE ALLERGY RELIEF) 50 MCG/ACT nasal spray Place into both nostrils as needed for allergies or rhinitis.   Past Month   metroNIDAZOLE (METROGEL) 0.75 % vaginal gel Place 1 Applicatorful vaginally daily. To stop 04/09/2022      Multiple Vitamins-Minerals (MULTIVITAMIN WITH MINERALS) tablet Take 1 tablet by mouth daily.   04/05/2022   nystatin cream (MYCOSTATIN) Apply 1 Application  topically 2 (two) times daily. Vaginally x10 days, to stop 04/10/2022   04/07/2022   Omega-3 Fatty Acids (FISH OIL PO) Take by mouth daily.   04/05/2022   OVER THE COUNTER MEDICATION Vitamin d / vitamin k 2 combination 1 tab daily   04/05/2022   PRESCRIPTION MEDICATION Estrogen patch 0.75 changes patch 2 x week   04/07/2022   progesterone (PROMETRIUM) 100 MG capsule Take 100 mg by mouth at bedtime.   04/07/2022   UNABLE TO FIND Cbd/melatonin  5 mg gummy prn qhs   04/07/2022   zolpidem (AMBIEN) 10 MG tablet Take 10 mg by mouth at bedtime as needed for sleep.   04/07/2022    Review of Systems  All other systems reviewed and are negative.   Blood  pressure 139/87, pulse 62, temperature 97.8 F (36.6 C), temperature source Oral, resp. rate 17, height 5\' 8"  (1.727 m), weight 80.2 kg, last menstrual period 04/23/2020, SpO2 100 %. Physical Exam Vitals reviewed.  Constitutional:      Appearance: She is normal weight.  HENT:     Head: Normocephalic.  Cardiovascular:     Rate and Rhythm: Normal rate.  Pulmonary:     Effort: Pulmonary effort is normal.  Abdominal:     Tenderness: There is no abdominal tenderness.  Genitourinary:    General: Normal vulva.  Musculoskeletal:        General: Normal range of motion.  Skin:    General: Skin is warm and dry.  Neurological:     General: No focal deficit present.     Mental Status: She is alert and oriented to person, place, and time.  Psychiatric:        Mood and Affect: Mood normal.        Behavior: Behavior normal.     Results for orders placed or performed during the hospital encounter of 04/08/22 (from the past 24 hour(s))  CBC     Status: None   Collection Time: 04/08/22  9:20 AM  Result Value Ref Range   WBC 5.1 4.0 - 10.5 K/uL   RBC 4.16 3.87 - 5.11 MIL/uL   Hemoglobin 13.8 12.0 - 15.0 g/dL   HCT 04/10/22 75.1 - 02.5 %   MCV 96.6 80.0 - 100.0 fL   MCH 33.2 26.0 - 34.0 pg   MCHC 34.3 30.0 - 36.0 g/dL   RDW 85.2 77.8 - 24.2 %   Platelets 172 150 - 400 K/uL   nRBC 0.0 0.0 - 0.2 %    No results found.  Assessment/Plan:  51Y P2 with PMB consented for hysteroscopy D&C procedure  Patient counseled in the office and again in the preop holding unit for the above listed procedure. She has been counseled on the risks of surgery including but not limited to bleeding, infection, damage to surrounding organs, and inherit risks of anesthesia. Consents signed and placed in patient chart.   -Admit to OR -NPO -No antibiotics indicated -SCD VTE ppx -No pregnancy testing indicated as patient is postmenopausal -Routine intraop/postop care -Anticipate DC home today. DC instructions and  follow up in the office reviewed  Trew Sunde A Jamilet Ambroise 04/08/2022, 10:56 AM

## 2022-04-09 ENCOUNTER — Encounter (HOSPITAL_BASED_OUTPATIENT_CLINIC_OR_DEPARTMENT_OTHER): Payer: Self-pay | Admitting: Obstetrics and Gynecology

## 2022-04-09 LAB — SURGICAL PATHOLOGY

## 2022-05-26 ENCOUNTER — Encounter (HOSPITAL_BASED_OUTPATIENT_CLINIC_OR_DEPARTMENT_OTHER): Payer: Self-pay | Admitting: Emergency Medicine

## 2022-05-26 ENCOUNTER — Other Ambulatory Visit: Payer: Self-pay

## 2022-05-26 ENCOUNTER — Emergency Department (HOSPITAL_BASED_OUTPATIENT_CLINIC_OR_DEPARTMENT_OTHER)
Admission: EM | Admit: 2022-05-26 | Discharge: 2022-05-26 | Disposition: A | Discharge status: Home / Self Care | Payer: BC Managed Care – PPO | Attending: Emergency Medicine | Admitting: Emergency Medicine

## 2022-05-26 ENCOUNTER — Emergency Department (HOSPITAL_BASED_OUTPATIENT_CLINIC_OR_DEPARTMENT_OTHER): Payer: BC Managed Care – PPO

## 2022-05-26 DIAGNOSIS — F419 Anxiety disorder, unspecified: Secondary | ICD-10-CM | POA: Diagnosis not present

## 2022-05-26 DIAGNOSIS — R791 Abnormal coagulation profile: Secondary | ICD-10-CM | POA: Insufficient documentation

## 2022-05-26 DIAGNOSIS — M79601 Pain in right arm: Secondary | ICD-10-CM | POA: Insufficient documentation

## 2022-05-26 DIAGNOSIS — R0600 Dyspnea, unspecified: Secondary | ICD-10-CM

## 2022-05-26 LAB — COMPREHENSIVE METABOLIC PANEL
ALT: 15 U/L (ref 0–44)
AST: 20 U/L (ref 15–41)
Albumin: 3.6 g/dL (ref 3.5–5.0)
Alkaline Phosphatase: 35 U/L — ABNORMAL LOW (ref 38–126)
Anion gap: 7 (ref 5–15)
BUN: 24 mg/dL — ABNORMAL HIGH (ref 6–20)
CO2: 25 mmol/L (ref 22–32)
Calcium: 8.5 mg/dL — ABNORMAL LOW (ref 8.9–10.3)
Chloride: 106 mmol/L (ref 98–111)
Creatinine, Ser: 1.14 mg/dL — ABNORMAL HIGH (ref 0.44–1.00)
GFR, Estimated: 59 mL/min — ABNORMAL LOW (ref >60)
Glucose, Bld: 98 mg/dL (ref 70–99)
Potassium: 3.8 mmol/L (ref 3.5–5.1)
Sodium: 138 mmol/L (ref 135–145)
Total Bilirubin: 0.6 mg/dL (ref 0.3–1.2)
Total Protein: 6.9 g/dL (ref 6.5–8.1)

## 2022-05-26 LAB — CBC WITH DIFFERENTIAL/PLATELET
Abs Immature Granulocytes: 0.01 10*3/uL (ref 0.00–0.07)
Basophils Absolute: 0 10*3/uL (ref 0.0–0.1)
Basophils Relative: 1 %
Eosinophils Absolute: 0.2 10*3/uL (ref 0.0–0.5)
Eosinophils Relative: 3 %
HCT: 37.9 % (ref 36.0–46.0)
Hemoglobin: 13.3 g/dL (ref 12.0–15.0)
Immature Granulocytes: 0 %
Lymphocytes Relative: 45 %
Lymphs Abs: 2.6 10*3/uL (ref 0.7–4.0)
MCH: 33.1 pg (ref 26.0–34.0)
MCHC: 35.1 g/dL (ref 30.0–36.0)
MCV: 94.3 fL (ref 80.0–100.0)
Monocytes Absolute: 0.6 10*3/uL (ref 0.1–1.0)
Monocytes Relative: 10 %
Neutro Abs: 2.4 10*3/uL (ref 1.7–7.7)
Neutrophils Relative %: 41 %
Platelets: 181 10*3/uL (ref 150–400)
RBC: 4.02 MIL/uL (ref 3.87–5.11)
RDW: 12.1 % (ref 11.5–15.5)
WBC: 5.8 10*3/uL (ref 4.0–10.5)
nRBC: 0 % (ref 0.0–0.2)

## 2022-05-26 LAB — TROPONIN I (HIGH SENSITIVITY): Troponin I (High Sensitivity): 2 ng/L (ref <18)

## 2022-05-26 LAB — D-DIMER, QUANTITATIVE: D-Dimer, Quant: 1.13 ug/mL-FEU — ABNORMAL HIGH (ref 0.00–0.50)

## 2022-05-26 MED ORDER — LORAZEPAM 2 MG/ML IJ SOLN
1.0000 mg | Freq: Once | INTRAMUSCULAR | Status: AC
  Administered 2022-05-26: 1 mg via INTRAVENOUS
  Filled 2022-05-26: qty 1

## 2022-05-26 MED ORDER — SODIUM CHLORIDE 0.9 % IV BOLUS
1000.0000 mL | Freq: Once | INTRAVENOUS | Status: AC
  Administered 2022-05-26: 1000 mL via INTRAVENOUS

## 2022-05-26 MED ORDER — IOHEXOL 350 MG/ML SOLN
75.0000 mL | Freq: Once | INTRAVENOUS | Status: AC | PRN
  Administered 2022-05-26: 75 mL via INTRAVENOUS

## 2022-05-26 NOTE — ED Provider Notes (Signed)
[facility] EMERGENCY DEPARTMENT Provider Note   CSN: [id number] Arrival date & time: 05/26/22  0354     History  Chief Complaint  Patient presents with   Shortness of Breath    Carol Rodriguez is a 51 y.o. female.  Patient is a 51 year old female with history of anxiety, unspecified hypercoagulable blood anomaly diagnosed many years ago at [facility] but not requiring treatment.  Patient presenting today with complaints of right arm pain.  This started earlier this evening and she was attempting to go to sleep.  She woke up in the night with worsening pain, then began to feel short of breath, anxious, and sweaty.  She denies any prior cardiac history or any history of blood clots.  But she does describe recent stress in her life.  She recently dropped her daughter off to college and is living at the house for the first time.  She is apparently unhappy with where she is and this has been stressful.  She denies recent exertional symptoms.  She has no other cardiac risk factors.  The history is provided by the patient.       Home Medications Prior to Admission medications   Medication Sig Start Date End Date Taking? Authorizing Provider  ALPRAZolam Prudy Feeler) 0.5 MG tablet Take 0.25 mg by mouth at bedtime as needed for anxiety.    [provider]  cetirizine (ZYRTEC) 10 MG tablet Take 10 mg by mouth daily.    [provider]  fluticasone (FLONASE ALLERGY RELIEF) 50 MCG/ACT nasal spray Place into both nostrils as needed for allergies or rhinitis.    [provider]  Multiple Vitamins-Minerals (MULTIVITAMIN WITH MINERALS) tablet Take 1 tablet by mouth daily.    [provider]  Omega-3 Fatty Acids (FISH OIL PO) Take by mouth daily.    [provider]  OVER THE COUNTER MEDICATION Vitamin d / vitamin k 2 combination 1 tab daily    [provider]  PRESCRIPTION MEDICATION Estrogen patch 0.75 changes patch 2 x week    [provider]  progesterone (PROMETRIUM) 100 MG capsule Take 100 mg by mouth at bedtime.    [provider]  UNABLE TO FIND Cbd/melatonin  5 mg gummy prn qhs    [provider]  zolpidem (AMBIEN) 10 MG tablet Take 10 mg by mouth at bedtime as needed for sleep.    [provider]      Allergies    Patient has no known allergies.    Review of Systems   Review of Systems  All other systems reviewed and are negative.   Physical Exam Updated Vital Signs Ht 5\' 8"  (1.727 m)   Wt 80.2 kg   LMP 04/23/2020   BMI 26.88 kg/m  Physical Exam Vitals and nursing note reviewed.  Constitutional:      General: She is not in acute distress.    Appearance: She is well-developed. She is not diaphoretic.     Comments: She is somewhat anxious appearing, but awake, alert, and appropriate  HENT:     Head: Normocephalic and atraumatic.  Cardiovascular:     Rate and Rhythm: Normal rate and regular rhythm.     Heart sounds: No murmur heard.    No friction rub. No gallop.  Pulmonary:     Effort: Pulmonary effort is normal. No respiratory distress.     Breath sounds: Normal breath sounds. No wheezing.  Abdominal:     General: Bowel sounds are normal. There is  no distension.     Palpations: Abdomen is soft.     Tenderness: There is no abdominal tenderness.  Musculoskeletal:        General: Normal range of motion.     Cervical back: Normal range of motion and neck supple.  Skin:    General: Skin is warm and dry.  Neurological:     General: No focal deficit present.     Mental Status: She is alert and oriented to person, place, and time.     ED Results / Procedures / Treatments   Labs (all labs ordered are listed, but only abnormal results are displayed) Labs Reviewed  COMPREHENSIVE METABOLIC PANEL    EKG EKG Interpretation  Date/Time:  Sunday May 26 2022 04:03:52 EDT Ventricular Rate:  83 PR Interval:    QRS Duration: 136 QT Interval:  374 QTC  Calculation: 440 R Axis:   61 Text Interpretation: Accelerated junctional rhythm Nonspecific intraventricular conduction delay Borderline T abnormalities, diffuse leads Confirmed by Geoffery Lyons ([id number]) on 05/26/2022 4:06:37 AM  Radiology No results found.  Procedures Procedures    Medications Ordered in ED Medications  sodium chloride 0.9 % bolus 1,000 mL (has no administration in time range)  LORazepam (ATIVAN) injection 1 mg (has no administration in time range)    ED Course/ Medical Decision Making/ A&P  This patient presents to the ED for concern of right arm pain, shortness of breath, this involves an extensive number of treatment options, and is a complaint that carries with it a high risk of complications and morbidity.  The differential diagnosis includes acute pulmonary embolism, acute coronary syndrome, pneumothorax, anxiety/panic   Co morbidities that complicate the patient evaluation  None   Additional history obtained:  No additional history or external records needed   Lab Tests:  I Ordered, and personally interpreted labs.  The pertinent results include: Unremarkable CBC, basic metabolic panel, and troponin.  D-dimer is elevated at 1.13   Imaging Studies ordered:  I ordered imaging studies including CTA of the chest I independently visualized and interpreted imaging which showed no evidence for pulmonary embolism or other acute intrathoracic process I agree with the radiologist interpretation   Cardiac Monitoring: / EKG:  The patient was maintained on a cardiac monitor.  I personally viewed and interpreted the cardiac monitored which showed an underlying rhythm of: Sinus rhythm   Consultations Obtained:  No emergent consultations needed   Problem List / ED Course / Critical interventions / Medication management  Patient presenting with complaints of right arm pain and shortness of breath as described in the HPI.  She reports some sort of clotting  abnormality that was diagnosed at [facility].  She reports a sibling with a history of blood clots and she is concerned about this possibility.  She does have a mildly elevated D-dimer, however CTA of the chest shows no evidence for PE.  There is no other pathology identified.  She is feeling better after receiving Ativan.  IV fluids.  She tells me she is under a great deal of stress related to her daughter going off to college in [place].  This could certainly play a component in her symptoms. Work-up inconsistent with ACS.  She does have nonspecific T wave abnormalities on her EKG, but no recent for comparison.  Initial troponin is negative. I have reviewed the patients home medicines and have made adjustments as needed   Social Determinants of Health:  None   Test / Admission - Considered:  No  emergent pathology identified that would necessitate admission.  I feel as though patient can safely be discharged with outpatient follow-up and as needed return.  Final Clinical Impression(s) / ED Diagnoses Final diagnoses:  None    Rx / DC Orders ED Discharge Orders     None         Geoffery Lyons, MD 05/26/22 [phone]

## 2022-05-26 NOTE — Discharge Instructions (Signed)
Follow-up with your primary doctor if symptoms are not improving in the next week, and return to the ER if you develop worsening pain, worsening breathing, high fevers, or for other new and concerning symptoms.

## 2022-05-26 NOTE — ED Triage Notes (Signed)
Reports right arm pain that started when going to bed.  Did travel last weekend a long distance.  On hormone replacement therapy.  Noted to be anxious.

## 2022-09-25 IMAGING — US US BREAST*R* LIMITED INC AXILLA
1 series · 8 of 8 positions shown · non-contrast
Comparison: Previous exam(s).

CLINICAL DATA: Patient presents with diffuse pain throughout the
right breast extending to the right axilla, mostly in the upper
outer quadrant of the right breast and axilla. No reported lumps.

EXAM:
DIGITAL DIAGNOSTIC UNILATERAL RIGHT MAMMOGRAM WITH TOMOSYNTHESIS AND
CAD; ULTRASOUND RIGHT BREAST LIMITED
TECHNIQUE: Right digital diagnostic mammography and breast tomosynthesis was
performed. The images were evaluated with computer-aided detection.;
Targeted ultrasound examination of the right breast was performed.

[Series 1: us breast*right* limited inc axilla · 0.07mm/px · 8 of 8 slices shown]
[im 1/8]
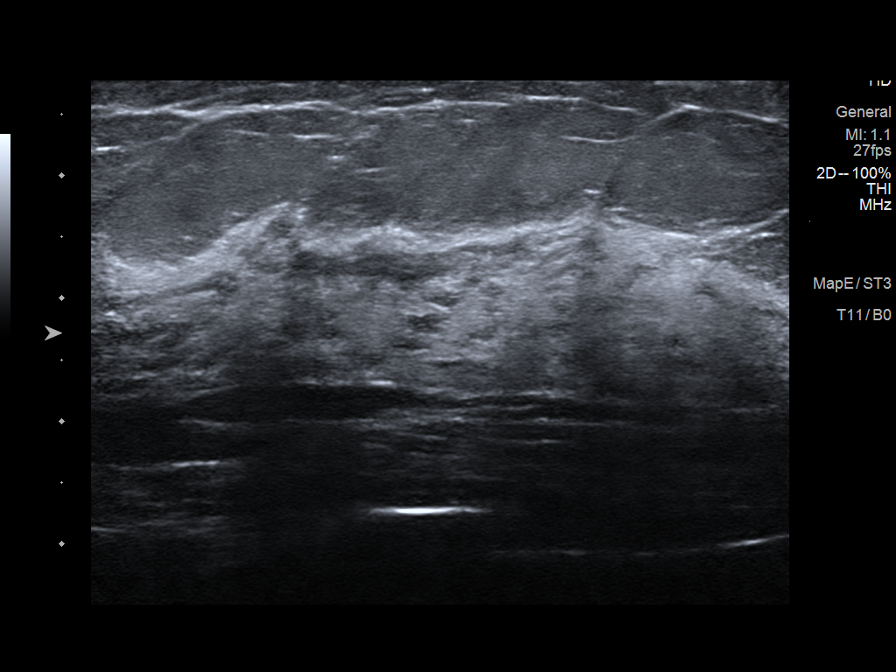
[im 2/8]
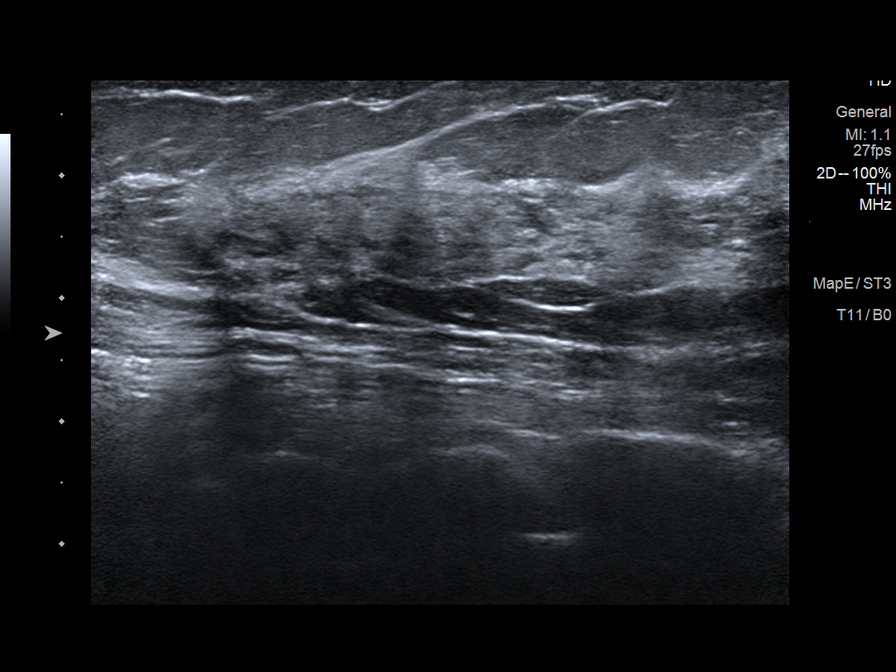
[im 3/8]
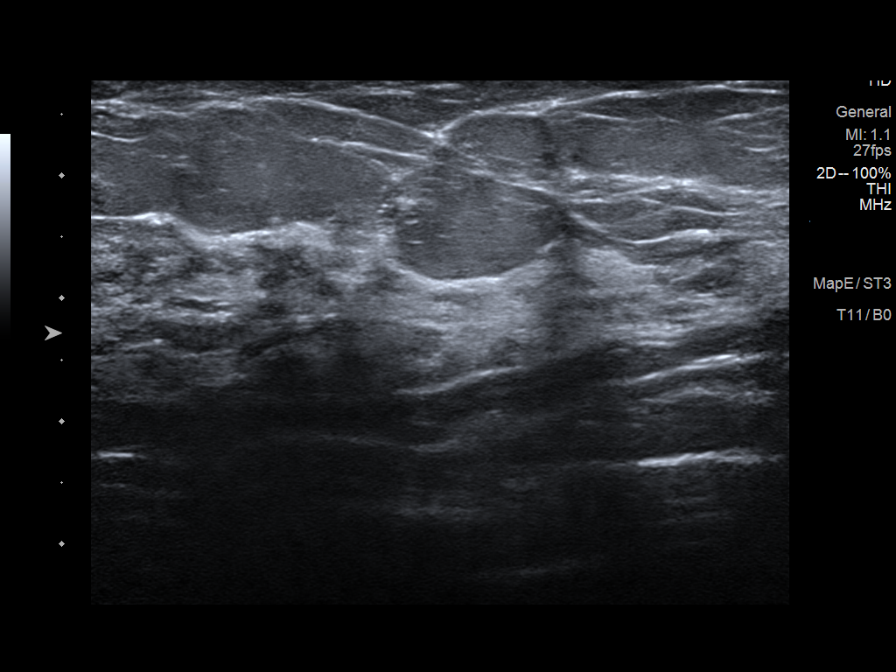
[im 4/8]
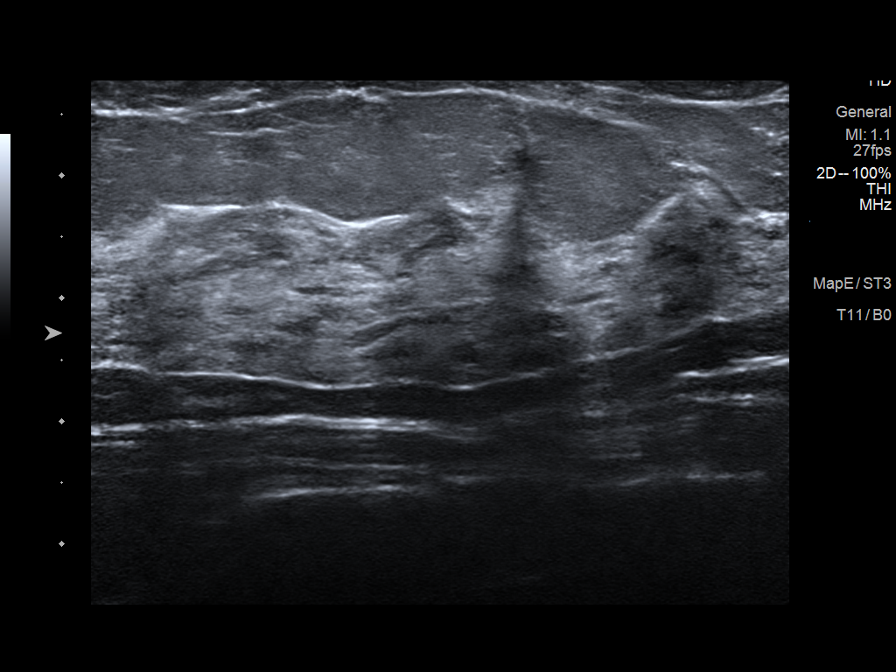
[im 5/8]
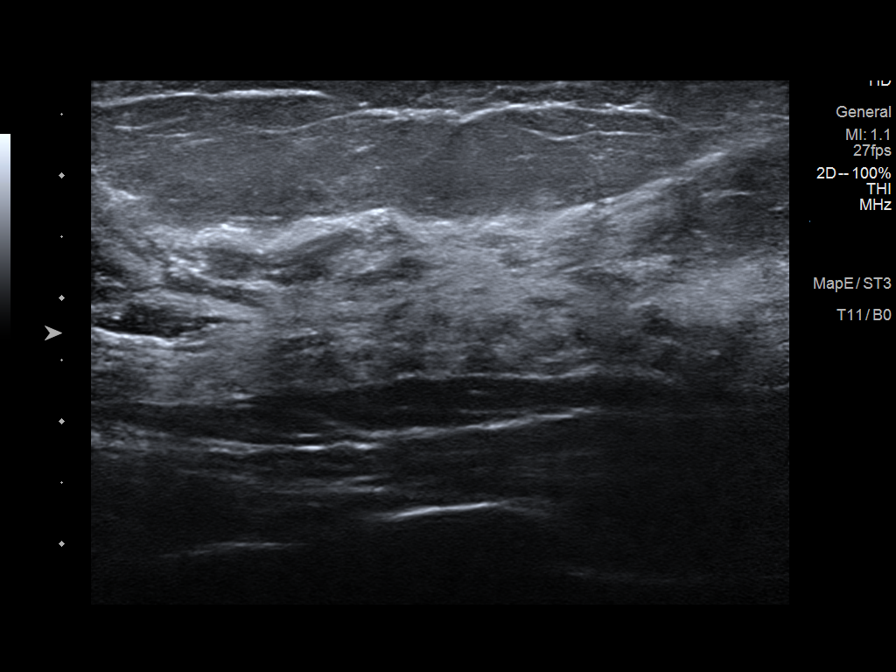
[im 6/8]
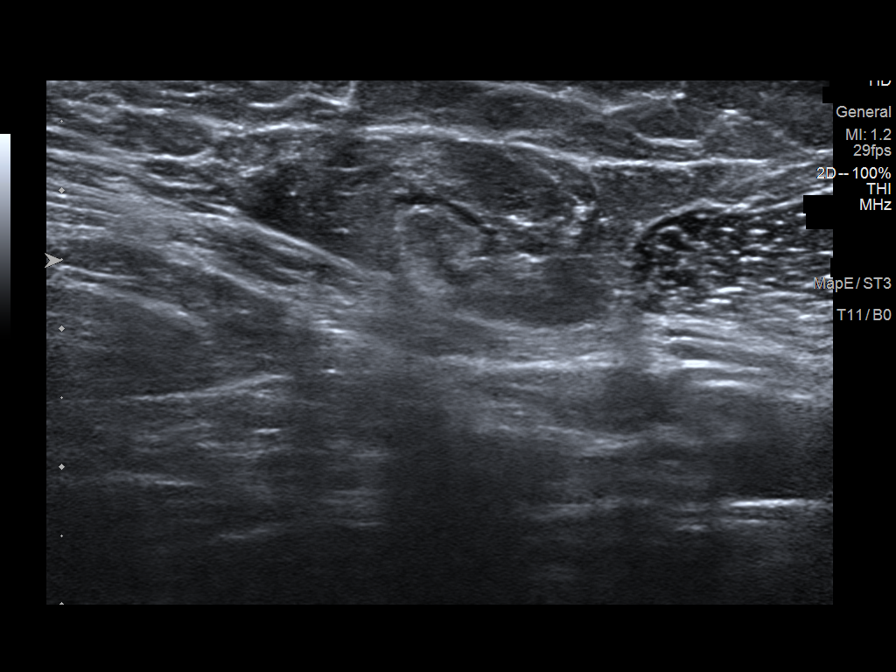
[im 7/8]
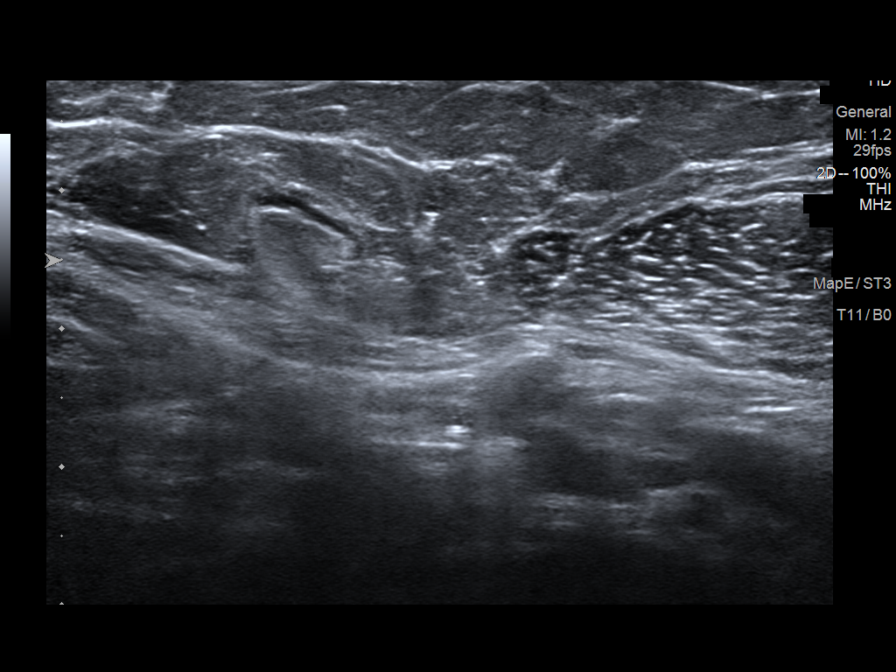
[im 8/8]
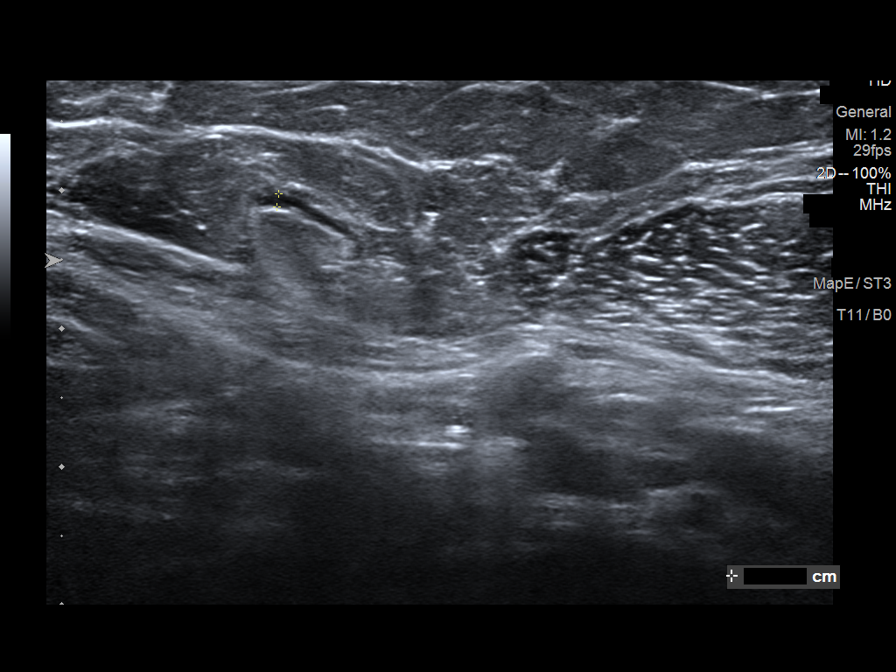

[8 of 8 positions shown; findings below may reference images not displayed]

ACR Breast Density Category c: The breast tissue is heterogeneously
dense, which may obscure small masses.
FINDINGS: There are no masses, areas of architectural distortion, areas of
significant asymmetry or suspicious calcifications. No mammographic
change.

Targeted ultrasound is performed, showing normal fibroglandular
tissue throughout the upper outer right breast. No abnormality in
the right axilla. No enlarged or abnormal lymph nodes.
IMPRESSION: 1. Negative exam.  No evidence of breast malignancy.

RECOMMENDATION:
1. Screening mammogram in April 2021, last screening exam dated
04/25/2020.
2. If this patient has a greater than 20% lifetime risk for
developing breast carcinoma, high risk annual screening breast MRI
should be considered based on the [employer]
recommendations.

I have discussed the findings and recommendations with the patient.
If applicable, a reminder letter will be sent to the patient
regarding the next appointment.

BI-RADS CATEGORY  1: Negative.

## 2023-11-26 ENCOUNTER — Other Ambulatory Visit (HOSPITAL_COMMUNITY): Payer: Self-pay

## 2023-11-26 ENCOUNTER — Ambulatory Visit (HOSPITAL_COMMUNITY)
Admission: RE | Admit: 2023-11-26 | Discharge: 2023-11-26 | Disposition: A | Discharge status: Home / Self Care | Source: Ambulatory Visit | Attending: Family Medicine | Admitting: Family Medicine

## 2023-11-26 ENCOUNTER — Telehealth (HOSPITAL_COMMUNITY): Payer: Self-pay

## 2023-11-26 ENCOUNTER — Other Ambulatory Visit (HOSPITAL_COMMUNITY): Payer: Self-pay | Admitting: Family Medicine

## 2023-11-26 ENCOUNTER — Ambulatory Visit (HOSPITAL_BASED_OUTPATIENT_CLINIC_OR_DEPARTMENT_OTHER)
Admission: RE | Admit: 2023-11-26 | Discharge: 2023-11-26 | Disposition: A | Discharge status: Home / Self Care | Source: Ambulatory Visit | Attending: Vascular Surgery | Admitting: Vascular Surgery

## 2023-11-26 VITALS — BP 126/96 | HR 67

## 2023-11-26 DIAGNOSIS — I82452 Acute embolism and thrombosis of left peroneal vein: Secondary | ICD-10-CM | POA: Diagnosis not present

## 2023-11-26 DIAGNOSIS — M79662 Pain in left lower leg: Secondary | ICD-10-CM | POA: Insufficient documentation

## 2023-11-26 MED ORDER — APIXABAN 5 MG PO TABS: 5.0000 mg | ORAL_TABLET | Freq: Two times a day (BID) | ORAL | 5 refills | Status: DC

## 2023-11-26 MED ORDER — APIXABAN (ELIQUIS) VTE STARTER PACK (10MG AND 5MG)
ORAL_TABLET | ORAL | 0 refills | Status: DC
  Filled 2023-11-26: qty 74, 30d supply, fill #0

## 2023-11-26 NOTE — Progress Notes (Signed)
 DVT Clinic Note  Name: Carol Rodriguez     MRN: [id number]     DOB: Oct 26, 1970     Sex: female  PCP: Merri Brunette, MD  Today's Visit: Visit Information: Initial Visit  Referred to DVT Clinic by: Primary Care - Dr. Wynelle Link Referred to CPP by: Dr. Chestine Spore Reason for referral:  Chief Complaint  Patient presents with   DVT   HISTORY OF PRESENT ILLNESS: Carol Rodriguez is a 53 y.o. female who presents after diagnosis of DVT for medication management. Patient was seen by her PCP today reporting new onset left lower extremity pain since 11/21/23. 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 She has had no prior history of VTE. Her brother has had 2 episodes of DVT and her father had a DVT as well. Ultrasound today showed acute DVT in the left peroneal veins, and she was referred to DVT Clinic to start treatment. Patient does take hormonal replacement therapy (estrogen, progesterone, testosterone) prescribed by [facility] for symptoms related to menopause. She no longer menstruates. No chest pain, SOB, palpitations.  Positive Thrombotic Risk Factors: Estrogen therapy, Testosterone therapy, Known thrombophilic condition Bleeding Risk Factors: None Present  Negative Thrombotic Risk Factors: Previous VTE, Recent surgery (within 3 months), Recent trauma (within 3 months), Recent admission to hospital with acute illness (within 3 months), Paralysis, paresis, or recent plaster cast immobilization of lower extremity, Central venous catheterization, Bed rest >72 hours within 3 months, Sedentary journey lasting >8 hours within 4 weeks, Pregnancy, Within 6 weeks postpartum, Recent cesarean section (within 3 months), Erythropoiesis-stimulating agent, Recent COVID diagnosis (within 3 months), Active cancer, Non-malignant, chronic inflammatory condition, Smoking, Obesity, Older age  Rx Insurance Coverage: Commercial Rx  Affordability: Eliquis is $30/30 day supply or $90/90 day supply. Provided patient with copay card to reduce refills to $10/month and used one time free card today for starter pack.  Rx Assistance Provided:  Free 30-day trial card Co-pay card Preferred Pharmacy: Starter pack filled during visit at [facility]. [facility]   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    see lov  10-10-2017 dr Amie [place] md note in epic, no treatment needed   PMB (postmenopausal bleeding)    Rhabdomyolysis 06/21/2013   arms swelled and acute kidney injury issue resolved   Sleep apnea    mild osa, using oral appliance for teeth grinding    Past Surgical History:  Procedure Laterality Date   colonscopy     2021 or 2022   HYSTEROSCOPY WITH D & C N/A 04/08/2022   Procedure: DILATATION AND CURETTAGE /HYSTEROSCOPY;  Surgeon: Toy Baker, DO;  Location: [facility];  Service: Gynecology;  Laterality: N/A;    Social History   Socioeconomic History   Marital status: Married    Spouse name: Not on file   Number of children: Not on file   Years of education: Not on file   Highest education level: Not on file  Occupational History   Not on file  Tobacco Use   Smoking status: Never   Smokeless tobacco: Never  Vaping  Use   Vaping status: Never Used  Substance and Sexual Activity   Alcohol use: Yes    Comment: social   Drug use: No   Sexual activity: Yes  Other Topics Concern   Not on file  Social History Narrative   Not on file   Social Drivers of Health   Financial Resource Strain: Not on file  Food Insecurity: Not on file  Transportation Needs: Not on file  Physical Activity: Not on file  Stress: Not  on file  Social Connections: Not on file  Intimate Partner Violence: Not on file    Family History  Problem Relation Age of Onset   Prostate cancer Father 69       'high gleason score' metastatic   Prostate cancer Paternal Uncle        radiation treatment   Breast cancer Maternal Grandmother 65   Heart attack Maternal Grandfather 64   Other Paternal Grandmother 40       had a 'cancerous colon polyp' very shortly before death   Heart attack Paternal Grandfather    Other Paternal Grandfather        might have had a prostate issue   Leukemia Cousin     Allergies as of 11/26/2023 - Review Complete 11/26/2023  Allergen Reaction Noted   Coffea arabica Other (See Comments) 01/24/2014   Egg solids, whole Other (See Comments) 01/24/2014   Gluten meal Other (See Comments) 01/24/2014    Current Outpatient Medications on File Prior to Encounter  Medication Sig Dispense Refill   ALPRAZolam (XANAX) 0.5 MG tablet Take 0.5 mg by mouth at bedtime as needed for anxiety.     cetirizine (ZYRTEC) 10 MG tablet Take 10 mg by mouth daily.     estradiol (ESTRACE) 0.1 MG/GM vaginal cream Place 1 Applicatorful vaginally 2 (two) times a week.     estradiol (VIVELLE-DOT) 0.075 MG/24HR Place 1 patch onto the skin 2 (two) times a week.     fluticasone (FLONASE ALLERGY RELIEF) 50 MCG/ACT nasal spray Place into both nostrils as needed for allergies or rhinitis.     MAGNESIUM GLYCINATE PO Take by mouth.     Multiple Vitamins-Minerals (MULTIVITAMIN WITH MINERALS) tablet Take 1 tablet by mouth daily.     NONFORMULARY OR COMPOUNDED ITEM Testosterone 2% cream     Omega-3 Fatty Acids (FISH OIL PO) Take by mouth daily.     OVER THE COUNTER MEDICATION Vitamin d / vitamin k 2 combination 1 tab daily     PRESCRIPTION MEDICATION Estrogen patch 0.75 changes patch 2 x week     Probiotic Product (PROBIOTIC DAILY PO) Take by mouth.     progesterone (PROMETRIUM) 100 MG capsule Take 300 mg by mouth at bedtime.     UNABLE TO  FIND Cbd/melatonin  5 mg gummy prn qhs     zolpidem (AMBIEN) 10 MG tablet Take 10 mg by mouth at bedtime as needed for sleep.     No current facility-administered medications on file prior to encounter.   REVIEW OF SYSTEMS:  Review of Systems  Respiratory:  Negative for shortness of breath.   Cardiovascular:  Negative for chest pain, palpitations and leg swelling.  Musculoskeletal:  Positive for myalgias.  Neurological:  Negative for dizziness and tingling.   PHYSICAL EXAMINATION:  Vitals:   11/26/23 1645  BP: (!) 126/96  Pulse: 67  SpO2: 99%   Physical Exam Vitals reviewed.  Cardiovascular:     Rate and Rhythm: Normal rate.  Pulmonary:  Effort: Pulmonary effort is normal.  Musculoskeletal:        General: Tenderness present.     Right lower leg: No edema.     Left lower leg: Edema (trace) present.  Skin:    Findings: No bruising or erythema.  Psychiatric:        Mood and Affect: Mood normal.        Behavior: Behavior normal.        Thought Content: Thought content normal.   Villalta Score for Post-Thrombotic Syndrome: Pain: Mild Cramps: Mild Heaviness: Mild Paresthesia: Mild Pruritus: Absent Pretibial Edema: Mild Skin Induration: Absent Hyperpigmentation: Absent Redness: Absent Venous Ectasia: Absent Pain on calf compression: Mild Villalta Preliminary Score: 6 Is venous ulcer present?: No If venous ulcer is present and score is <15, then 15 points total are assigned: Absent Villalta Total Score: 6  LABS:  CBC     Component Value Date/Time   WBC 5.8 05/26/2022 0433   RBC 4.02 05/26/2022 0433   HGB 13.3 05/26/2022 0433   HCT 37.9 05/26/2022 0433   PLT 181 05/26/2022 0433   MCV 94.3 05/26/2022 0433   MCH 33.1 05/26/2022 0433   MCHC 35.1 05/26/2022 0433   RDW 12.1 05/26/2022 0433   LYMPHSABS 2.6 05/26/2022 0433   MONOABS 0.6 05/26/2022 0433   EOSABS 0.2 05/26/2022 0433   BASOSABS 0.0 05/26/2022 0433    Hepatic Function      Component Value  Date/Time   PROT 6.9 05/26/2022 0433   ALBUMIN 3.6 05/26/2022 0433   AST 20 05/26/2022 0433   ALT 15 05/26/2022 0433   ALKPHOS 35 (L) 05/26/2022 0433   BILITOT 0.6 05/26/2022 0433    Renal Function   Lab Results  Component Value Date   CREATININE 1.14 (H) 05/26/2022   CREATININE 0.99 04/08/2022   CREATININE 0.92 06/25/2013    CrCl cannot be calculated (Patient's most recent lab result is older than the maximum 21 days allowed.).   Labs from [facility] 05/20/23:  -eGFR 62, Hgb 13.2, ALT 11, AST 17  [facility] Vascular Lab Studies:  11/26/23 VAS [place] LOWER EXTREMITY VENOUS LEFT (DVT) Summary:  RIGHT:  - No evidence of common femoral vein obstruction.   LEFT:  - Findings consistent with acute deep vein thrombosis involving the left  peroneal veins.   ASSESSMENT: Location of DVT: Left distal vein  Patient without prior history of DVT diagnosed with acute DVT in the left peroneal veins. Patient reports personal history of Factor V Leiden mutation which was tested after her brother and father had multiple incidences of DVT. Other risk factors present include estrogen, testosterone, and high doses of progesterone. She will need to discuss whether she needs to stop these with the prescribing provider though generally it would not be recommended after a VTE diagnosis. Will start anticoagulation with Eliquis as patient prefers medication that does not require to be taken with food. No concerns on labs from 04/2023 for Eliquis start. Provided starter pack during visit today and refills to preferred pharmacy. No concerns related to medication access or adherence at this time. Counseled patient extensively on Eliquis, and all questions have been answered. Will refer her to hematology for further discussions on duration of anticoagulation in light of reported history of Factor V Leiden mutation.   PLAN: -Start apixaban (Eliquis) 10 mg twice daily for 7 days followed by 5 mg twice daily. -Expected duration of  therapy: per hematology. Therapy started on 11/26/23. -Patient educated on purpose, proper use and potential adverse  effects of apixaban (Eliquis). -Discussed importance of taking medication around the same time every day. -Advised patient of medications to avoid (NSAIDs, aspirin doses >100 mg daily). -Educated that Tylenol (acetaminophen) is the preferred analgesic to lower the risk of bleeding. -Advised patient to alert all providers of anticoagulation therapy prior to starting a new medication or having a procedure. -Emphasized importance of monitoring for signs and symptoms of bleeding (abnormal bruising, prolonged bleeding, nose bleeds, bleeding from gums, discolored urine, black tarry stools). -Educated patient to present to the ED if emergent signs and symptoms of new thrombosis occur.   Follow up: Referral to hematology placed. DVT Clinic available as needed.   Pervis Hocking, PharmD, [place], CPP Deep Vein Thrombosis Clinic Clinical Pharmacist Practitioner

## 2023-11-26 NOTE — Patient Instructions (Signed)
-  Start apixaban (Eliquis) 10 mg twice daily for 7 days followed by 5 mg twice daily. -[facility] You may need to call the pharmacy to ask them to fill this when you start to run low on your current supply.  -We are referring you to [facility] hematology.  -It is important to take your medication around the same time every day.  -Avoid NSAIDs like ibuprofen (Advil, Motrin) and naproxen (Aleve) as well as aspirin doses over 100 mg daily. -Tylenol (acetaminophen) is the preferred over the counter pain medication to lower the risk of bleeding. -Be sure to alert all of your health care providers that you are taking an anticoagulant prior to starting a new medication or having a procedure. -Monitor for signs and symptoms of bleeding (abnormal bruising, prolonged bleeding, nose bleeds, bleeding from gums, discolored urine, black tarry stools). If you have fallen and hit your head OR if your bleeding is severe or not stopping, seek emergency care.  -Go to the emergency room if emergent signs and symptoms of new clot occur (new or worse swelling and pain in an arm or leg, shortness of breath, chest pain, fast or irregular heartbeats, lightheadedness, dizziness, fainting, coughing up blood) or if you experience a significant color change (pale or blue) in the extremity that has the DVT.  -We recommend you wear compression stockings (20-30 mmHg) as long as you are having swelling or pain. Be sure to purchase the correct size and take them off at night.   If you have any questions or need to reschedule an appointment, please call [phone] [facility]. If you are having an emergency, call 911 or present to the nearest emergency room.   What is a DVT?  -Deep vein thrombosis (DVT) is a condition in which a blood clot forms in a vein of the deep venous system which can occur in the lower leg, thigh, pelvis, arm, or neck. This condition is serious and can be life-threatening if the  clot travels to the arteries of the lungs and causing a blockage (pulmonary embolism, PE). A DVT can also damage veins in the leg, which can lead to long-term venous disease, leg pain, swelling, discoloration, and ulcers or sores (post-thrombotic syndrome).  -Treatment may include taking an anticoagulant medication to prevent more clots from forming and the current clot from growing, wearing compression stockings, and/or surgical procedures to remove or dissolve the clot.

## 2023-11-26 NOTE — Telephone Encounter (Signed)
 Patient Advocate Encounter  Test billing for Eliquis, Xarelto show that both medications would cost $30 for 30 days, or $90 for 90 days under this plan. Patient is eligible to use copay savings cards under this coverage.  Burnell Blanks, CPhT Rx Patient Advocate Phone: [phone]

## 2023-12-18 ENCOUNTER — Other Ambulatory Visit: Payer: Self-pay

## 2023-12-18 ENCOUNTER — Ambulatory Visit: Attending: Obstetrics and Gynecology | Admitting: Physical Therapy

## 2023-12-18 DIAGNOSIS — R279 Unspecified lack of coordination: Secondary | ICD-10-CM | POA: Insufficient documentation

## 2023-12-18 DIAGNOSIS — R293 Abnormal posture: Secondary | ICD-10-CM | POA: Insufficient documentation

## 2023-12-18 DIAGNOSIS — M6281 Muscle weakness (generalized): Secondary | ICD-10-CM | POA: Diagnosis present

## 2023-12-18 NOTE — Therapy (Signed)
 OUTPATIENT PHYSICAL THERAPY FEMALE PELVIC EVALUATION   Patient Name: Carol Rodriguez MRN: [id number] DOB:08-29-1971, 53 y.o., female Today's Date: 12/18/2023  END OF SESSION:  PT End of Session - 12/18/23 1629     Visit Number 1    Date for PT Re-Evaluation 06/19/24    Authorization Type [employer]    PT Start Time 1445    PT Stop Time 1526    PT Time Calculation (min) 41 min    Activity Tolerance Patient tolerated treatment well    Behavior During Therapy WFL for tasks assessed/performed             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    see lov  10-10-2017 dr Amie [place] md note in epic, no treatment needed   PMB (postmenopausal bleeding)    Rhabdomyolysis 06/21/2013   arms swelled and acute kidney injury issue resolved   Sleep apnea    mild osa, using oral appliance for teeth grinding   Past Surgical History:  Procedure Laterality Date   colonscopy     2021 or 2022   HYSTEROSCOPY WITH D & C N/A 04/08/2022   Procedure: DILATATION AND CURETTAGE /HYSTEROSCOPY;  Surgeon: Toy Baker, DO;  Location: [facility];  Service: Gynecology;  Laterality: N/A;   Patient Active Problem List   Diagnosis Date Noted   Acute deep vein thrombosis (DVT) of left peroneal vein (HCC) 11/26/2023   Genetic testing 04/17/2018   Family history of prostate cancer    Family history of breast cancer    Family history of leukemia    Rhabdomyolysis 06/22/2013   Acute kidney injury (HCC) 06/22/2013    PCP: Merri Brunette, MD   REFERRING PROVIDER: Toy Baker, DO   REFERRING DIAG: M62.9 (ICD-10-CM) - Disorder of muscle, unspecified  THERAPY DIAG:  Muscle weakness (generalized)  Abnormal posture  Unspecified lack of  coordination  Rationale for Evaluation and Treatment: Rehabilitation  ONSET DATE: one year  SUBJECTIVE:                                                                                                                                                                                           SUBJECTIVE STATEMENT: Lt DVT peroneal vein. Is having small urinary incontinence with stressors (working out, blowing nose, and coughing/sneezing). Has been on  HRT to help but with DVT may need to come off hormones, unsure.   Total liquid (black tea in morning, sparkling water, sometimes alcohol, sometimes Poppi's) usually total 48 oz PAIN:  Are you having pain? No   PRECAUTIONS: None  RED FLAGS: None   WEIGHT BEARING RESTRICTIONS: No  FALLS:  Has patient fallen in last 6 months? No  OCCUPATION: director of [employer]   ACTIVITY LEVEL : walking daily 20-30 mins but not high intensity; does weight lifting 3x weekly   PLOF: Independent  PATIENT GOALS: to have less urine symptoms   PERTINENT HISTORY:  DVT in Lt peroneal vein, does take HRT, HYSTEROSCOPY WITH D & C, Rhabdomyolysis,  Sexual abuse: No  BOWEL MOVEMENT: Pain with bowel movement: No Type of bowel movement:Type (Bristol Stool Scale) 7, Frequency daily, and Strain no Fully empty rectum: No Leakage: No Pads: No Fiber supplement/laxative Yes - Psyllium Husk   URINATION: Pain with urination: No Fully empty bladder: No - unsure Stream: Strong and Weak Urgency: No Frequency: usually 1-2x nightly; 1.5-2 hours daily  Leakage: Urge to void, Coughing, Sneezing, Laughing, Exercise, and Lifting Pads: No  INTERCOURSE:  Ability to have vaginal penetration Yes  Pain with intercourse: none DrynessYes  Climax: not painful  Marinoff Scale: 0/3  PREGNANCY: Vaginal deliveries 2 Tearing Yes: with first needed stitches and reports traumatic, vacuum delivery   C-section deliveries 0 Currently pregnant  No  PROLAPSE: None   OBJECTIVE:  Note: Objective measures were completed at Evaluation unless otherwise noted.  DIAGNOSTIC FINDINGS:   COGNITION: Overall cognitive status: Within functional limits for tasks assessed     SENSATION: Light touch: Appears intact   POSTURE: rounded shoulders   LUMBARAROM/PROM:  A/PROM A/PROM  eval  Flexion WFL  Extension WFL  Right lateral flexion WFL  Left lateral flexion WFL  Right rotation WFL  Left rotation WFL   (Blank rows = not tested)  LOWER EXTREMITY ROM:  Bil hips limited by 25% in ROM in bil hamstrings  LOWER EXTREMITY MMT:  Bil hip abduction 3+/5 all others 4/5 PALPATION:   General: tight bil lumbar paraspinals, glute and jaw clenching noted and pt reported  Pelvic Alignment: WFL  Abdominal: no TTP but does have tightness in Rt side of abdominal quadrants                 External Perineal Exam: [facility]                             Internal Pelvic Floor: no TTP, tightness noted in deep layer of pelvic floor bil and superficial  Patient confirms identification and approves PT to assess internal pelvic floor and treatment Yes No emotional/communication barriers or cognitive limitation. Patient is motivated to learn. Patient understands and agrees with treatment goals and plan. PT explains patient will be examined in standing, sitting, and lying down to see how their muscles and joints work. When they are ready, they will be asked to remove their underwear so PT can examine their perineum. The patient is also given the option of providing their own chaperone as one is not provided in our facility. The patient also has the right and is explained the right to defer or refuse any part of the evaluation or treatment including the internal exam. With the patient's consent, PT will use one gloved finger to gently assess the muscles of the pelvic floor, seeing how well it contracts and relaxes  and if there is muscle symmetry. After, the  patient will get dressed and PT and patient will discuss exam findings and plan of care. PT and patient discuss plan of care, schedule, attendance policy and HEP activities.   PELVIC MMT:   MMT eval  Vaginal 3/5; 8s; 6 reps  Internal Anal Sphincter   External Anal Sphincter   Puborectalis   Diastasis Recti   (Blank rows = not tested)        TONE: Slightly increased   PROLAPSE: Not seen in hooklying with cough   TODAY'S TREATMENT:                                                                                                                              DATE:   12/18/23 EVAL Examination completed, findings reviewed, pt educated on POC, HEP, and bladder irritants. Pt motivated to participate in PT and agreeable to attempt recommendations.     PATIENT EDUCATION:  Education details: [id number], bladder irritants  Person educated: Patient Education method: Explanation, Demonstration, Tactile cues, Verbal cues, and Handouts Education comprehension: verbalized understanding, returned demonstration, verbal cues required, tactile cues required, and needs further education  HOME EXERCISE PROGRAM: [id number]  ASSESSMENT:  CLINICAL IMPRESSION: Patient is a 53 y.o. female  who was seen today for physical therapy evaluation and treatment for urinary incontinence with stressors, fecal urgency with type 6-7 stools but regular to every morning, is taking HRT as of now but does have DVT in LLE and under treatment for this. Pt unsure if she will be able to remain on HRT and would like to address pelvic floor deficits. Pt found to have decreased flexibility in spine and hips, decreased core and hip strength, fascial restrictions in abdomen at rt quadrant side. Patient consented to internal pelvic floor assessment vaginally this date and found to have decreased strength, endurance, and coordination. Patient benefited from verbal cues for improved technique with pelvic floor contractions and breathing  coordination. Pt tolerated well, denied pain. Pt would benefit from additional PT to further address deficits.    OBJECTIVE IMPAIRMENTS: decreased activity tolerance, decreased coordination, decreased endurance, decreased mobility, decreased strength, increased fascial restrictions, increased muscle spasms, impaired flexibility, improper body mechanics, and postural dysfunction.   ACTIVITY LIMITATIONS: carrying, lifting, squatting, continence, and locomotion level  PARTICIPATION LIMITATIONS: community activity  PERSONAL FACTORS: Time since onset of injury/illness/exacerbation and 1 comorbidity: medical history  are also affecting patient's functional outcome.   REHAB POTENTIAL: Good  CLINICAL DECISION MAKING: Stable/uncomplicated  EVALUATION COMPLEXITY: Low   GOALS: Goals reviewed with patient? Yes  SHORT TERM GOALS: Target date: 01/15/24  Pt to be I with HEP.  Baseline: Goal status: INITIAL  2.  Pt to be I with relaxation techniques to decreased stress and gripping at pelvic floor.  Baseline:  Goal status: INITIAL  3.  Pt to be I with abdominal massage and voiding mechanics for improved bowel type and habits.  Baseline:  Goal status:  INITIAL  4.  Pt to be I with knack for decreased urinary incontinence with stressors.  Baseline:  Goal status: INITIAL  LONG TERM GOALS: Target date: 06/19/24  Pt to be I with advanced HEP.  Baseline:  Goal status: INITIAL  2.  Pt to report improved time between bladder voids to at least 3 hours for improved QOL with decreased urinary frequency.   Baseline:  Goal status: INITIAL  3.  Pt to demonstrate improved coordination of pelvic floor and breathing mechanics with 20# squat with appropriate synergistic patterns to decrease pain and leakage at least 75% of the time.    Baseline:  Goal status: INITIAL  4.  Pt report at least stool type 5 with 75% of bowel movement for improved stool stype on Bristol scale and decreased fecal urgency.   Baseline:  Goal status: INITIAL  5.  Pt to demonstrate at least 5/5 bil hip strength for improved pelvic stability and functional squats without leakage.  Baseline:  Goal status: INITIAL   6.  Pt to report no more than 1 urinary incontinence instance in a week for improved confidence with working out Baseline:  Goal status: INITIAL   PLAN:  PT FREQUENCY: 1x/week  PT DURATION:  8 sessions  PLANNED INTERVENTIONS: 97110-Therapeutic exercises, 97530- Therapeutic activity, 97112- Neuromuscular re-education, 97535- Self Care, [id number]- Manual therapy, Patient/Family education, Taping, Dry Needling, Joint mobilization, Spinal mobilization, Scar mobilization, DME instructions, Cryotherapy, Moist heat, and Biofeedback  PLAN FOR NEXT SESSION: core and hip strengthening with pelvic floor/breathing coordination, knack, urge drill   Otelia Sergeant, PT, DPT 03/27/254:42 PM

## 2023-12-18 NOTE — Patient Instructions (Signed)

## 2023-12-22 ENCOUNTER — Telehealth: Payer: Self-pay

## 2023-12-22 NOTE — Telephone Encounter (Signed)
 Left message on voicemail about appointment on 12/23/23

## 2023-12-23 ENCOUNTER — Encounter: Payer: Self-pay | Admitting: Hematology and Oncology

## 2023-12-23 ENCOUNTER — Inpatient Hospital Stay: Attending: Hematology and Oncology | Admitting: Hematology and Oncology

## 2023-12-23 ENCOUNTER — Inpatient Hospital Stay

## 2023-12-23 VITALS — BP 111/75 | HR 62 | Temp 98.0°F | Resp 17 | Ht 68.9 in | Wt 185.5 lb

## 2023-12-23 DIAGNOSIS — Z803 Family history of malignant neoplasm of breast: Secondary | ICD-10-CM | POA: Diagnosis not present

## 2023-12-23 DIAGNOSIS — Z806 Family history of leukemia: Secondary | ICD-10-CM | POA: Diagnosis not present

## 2023-12-23 DIAGNOSIS — I82452 Acute embolism and thrombosis of left peroneal vein: Secondary | ICD-10-CM | POA: Diagnosis not present

## 2023-12-23 DIAGNOSIS — D6851 Activated protein C resistance: Secondary | ICD-10-CM

## 2023-12-23 NOTE — Progress Notes (Signed)
 [facility] CONSULT NOTE  Patient Care Team: Merri Brunette, MD as PCP - General (Family Medicine)  CHIEF COMPLAINTS/PURPOSE OF CONSULTATION:  LLE DVT  ASSESSMENT & PLAN:  Acute deep vein thrombosis (DVT) of left peroneal vein (HCC) Distal Deep Vein Thrombosis (DVT) She experienced a distal DVT in the peroneal vein approximately one month ago, provoked by hormone replacement therapy (HRT) in the context of heterozygous Factor V Leiden mutation. The risk of pulmonary embolism is lower with distal DVTs, but anticoagulation is necessary. She is currently on Eliquis (apixaban) 5 mg twice daily with symptom improvement. She is concerned about the persistence of the clot and has requested a follow-up ultrasound, especially with upcoming flights.  - Continue Eliquis 5 mg twice daily for at least three months. Ideal duration of treatment is 3-6 months provided she discontinues HRT.  - Order vascular ultrasound of the left leg in July to assess the status of the DVT. - Advise wearing compression socks during long flights or car rides. - Discussed the option of continuing Eliquis if she remains on HRT.  Hormone Replacement Therapy (HRT) Use She is on HRT for menopausal symptoms, including hot flashes. There is a family history of blood clots, and she has a heterozygous Factor V Leiden mutation, increasing the risk of thrombosis. She has received conflicting advice regarding the continuation of HRT. Discontinuing HRT is advised to reduce the risk of further clotting events. If she insists on continuing HRT, ongoing anticoagulation with Eliquis is recommended. She is considering the risks and benefits of continuing HRT and is aware of the potential increased risk of breast cancer associated with HRT.  - If she chooses to continue HRT, continue Eliquis indefinitely.  Heterozygous Factor V Leiden Mutation She has a heterozygous Factor V Leiden mutation, which predisposes her to an increased  risk of thrombosis. This genetic factor, combined with HRT, likely contributed to the development of the recent DVT. - Consider running a full clotting profile during the next visit to assess for other potential clotting disorders.  We have discussed risks of recurrence with provoked DVT. She appears to be leaning towards indefinite anticoagulation even if she decided to stop HRT. This is reasonable given her family history I will see her back in 3 months  No orders of the defined types were placed in this encounter.    HISTORY OF PRESENTING ILLNESS:  Carol Rodriguez 53 y.o. female is here because of LLE DVT  Discussed the use of AI scribe software for clinical note transcription with the patient, who gave verbal consent to proceed.  History of Present Illness   Carol Rodriguez is a 53 year old female with a history of heterozygous Factor V Leiden mutation who presents with a recent deep vein thrombosis (DVT).  She has a recent diagnosis of deep vein thrombosis (DVT) in the left leg, specifically in the peroneal vein, which occurred approximately one month ago. Initial symptoms included a severe cramp and aching deep in the calf, initially attributed to increased weight training. The pain was unusual as it was localized to one calf. She is currently on Eliquis, 5 mg twice daily, and is on day 28 of treatment, reporting improvement in symptoms with no current swelling or pain in the left leg and improvement in cramping since starting Eliquis.  She has a known heterozygous Factor V Leiden mutation, identified after her brother experienced pulmonary embolisms and was hospitalized in the ICU. Her brother's condition prompted her to get tested. She has  a family history of blood clots, with her brother and father both having experienced them. Additionally, there is a family history of breast cancer in her maternal grandmother and prostate cancer in her father.  She is currently on hormone replacement  therapy (HRT) for menopausal symptoms, which includes an estrogen patch, progesterone, vaginal estrogen, and testosterone cream. She has been on HRT for over two years, with recent dose adjustments approximately six months ago. She is concerned about the risk of blood clots associated with her HRT, especially given her family history and personal experience with DVT.  She inquires about the use of supplements such as fish oil and turmeric, which she has been taking for their anti-inflammatory properties, and is aware of their potential blood-thinning effects. She has been taking fish oil for about 20 years and recently added turmeric to her regimen. She also experiences frequent sinus infections and is concerned about the use of decongestants like Sudafed while on blood thinners.  She has a history of seronegative rheumatoid arthritis, which she manages through diet and exercise, specifically by avoiding gluten and dairy, which helps prevent inflammation. She is not currently on any medications for arthritis.   All other systems were reviewed with the patient and are negative.  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    see lov  10-10-2017 dr Amie [place] md note in epic, no treatment needed   PMB (postmenopausal bleeding)    Rhabdomyolysis 06/21/2013   arms swelled and acute kidney injury issue resolved   Sleep apnea    mild osa, using oral appliance for teeth grinding    SURGICAL HISTORY: Past Surgical History:  Procedure Laterality Date   colonscopy     2021 or 2022   HYSTEROSCOPY WITH D & C N/A 04/08/2022   Procedure: DILATATION AND CURETTAGE /HYSTEROSCOPY;  Surgeon: Toy Baker, DO;  Location: [facility];   Service: Gynecology;  Laterality: N/A;    SOCIAL HISTORY: Social History   Socioeconomic History   Marital status: Married    Spouse name: Not on file   Number of children: Not on file   Years of education: Not on file   Highest education level: Not on file  Occupational History   Not on file  Tobacco Use   Smoking status: Never   Smokeless tobacco: Never  Vaping Use   Vaping status: Never Used  Substance and Sexual Activity   Alcohol use: Yes    Comment: social   Drug use: No   Sexual activity: Yes  Other Topics Concern   Not on file  Social History Narrative   Not on file   Social Drivers of Health   Financial Resource Strain: Not on file  Food Insecurity: Not on file  Transportation Needs: Not on file  Physical Activity: Not on file  Stress: Not on file  Social Connections: Not on file  Intimate Partner Violence: Not on file    FAMILY HISTORY: Family History  Problem Relation Age of Onset   Prostate cancer Father 38       'high gleason score' metastatic   Prostate cancer Paternal Uncle  radiation treatment   Breast cancer Maternal Grandmother 65   Heart attack Maternal Grandfather 37   Other Paternal Grandmother 38       had a 'cancerous colon polyp' very shortly before death   Heart attack Paternal Grandfather    Other Paternal Grandfather        might have had a prostate issue   Leukemia Cousin     ALLERGIES:  is allergic to coffea arabica; egg solids, whole; and gluten meal.  MEDICATIONS:  Current Outpatient Medications  Medication Sig Dispense Refill   ALPRAZolam (XANAX) 0.5 MG tablet Take 0.5 mg by mouth at bedtime as needed for anxiety.     apixaban (ELIQUIS) 5 MG TABS tablet Take 1 tablet (5 mg total) by mouth 2 (two) times daily. Start taking after completion of starter pack. 60 tablet 5   APIXABAN (ELIQUIS) VTE STARTER PACK (10MG  AND 5MG ) Take as directed on package: start with two-5mg  tablets twice daily for 7 days. On day 8, switch  to one-5mg  tablet twice daily. 74 each 0   cetirizine (ZYRTEC) 10 MG tablet Take 10 mg by mouth daily.     estradiol (ESTRACE) 0.1 MG/GM vaginal cream Place 1 Applicatorful vaginally 2 (two) times a week.     estradiol (VIVELLE-DOT) 0.075 MG/24HR Place 1 patch onto the skin 2 (two) times a week.     fluticasone (FLONASE ALLERGY RELIEF) 50 MCG/ACT nasal spray Place into both nostrils as needed for allergies or rhinitis.     MAGNESIUM GLYCINATE PO Take by mouth.     Multiple Vitamins-Minerals (MULTIVITAMIN WITH MINERALS) tablet Take 1 tablet by mouth daily.     NONFORMULARY OR COMPOUNDED ITEM Testosterone 2% cream     Omega-3 Fatty Acids (FISH OIL PO) Take by mouth daily.     OVER THE COUNTER MEDICATION Vitamin d / vitamin k 2 combination 1 tab daily     PRESCRIPTION MEDICATION Estrogen patch 0.75 changes patch 2 x week     Probiotic Product (PROBIOTIC DAILY PO) Take by mouth.     progesterone (PROMETRIUM) 100 MG capsule Take 300 mg by mouth at bedtime.     UNABLE TO FIND Cbd/melatonin  5 mg gummy prn qhs     zolpidem (AMBIEN) 10 MG tablet Take 10 mg by mouth at bedtime as needed for sleep.     No current facility-administered medications for this visit.     PHYSICAL EXAMINATION: ECOG PERFORMANCE STATUS: 0 - Asymptomatic  Vitals:   12/23/23 1452  BP: 111/75  Pulse: 62  Resp: 17  Temp: 98 F (36.7 C)  SpO2: 100%   Filed Weights   12/23/23 1452  Weight: 185 lb 8 oz (84.1 kg)    GENERAL:alert, no distress and comfortable SKIN: skin color, texture, turgor are normal, no rashes or significant lesions EYES: normal, conjunctiva are pink and non-injected, sclera clear OROPHARYNX:no exudate, no erythema and lips, buccal mucosa, and tongue normal  NECK: supple, thyroid normal size, non-tender, without nodularity LYMPH:  no palpable lymphadenopathy in the cervical, axillary  LUNGS: clear to auscultation and percussion with normal breathing effort HEART: regular rate & rhythm and no  murmurs and no lower extremity edema ABDOMEN:abdomen soft, non-tender and normal bowel sounds Musculoskeletal:no cyanosis of digits and no clubbing  PSYCH: alert & oriented x 3 with fluent speech NEURO: no focal motor/sensory deficits  LABORATORY DATA:  I have reviewed the data as listed Lab Results  Component Value Date   WBC 5.8 05/26/2022   HGB 13.3  05/26/2022   HCT 37.9 05/26/2022   MCV 94.3 05/26/2022   PLT 181 05/26/2022     Chemistry      Component Value Date/Time   NA 138 05/26/2022 0433   K 3.8 05/26/2022 0433   CL 106 05/26/2022 0433   CO2 25 05/26/2022 0433   BUN 24 (H) 05/26/2022 0433   CREATININE 1.14 (H) 05/26/2022 0433      Component Value Date/Time   CALCIUM 8.5 (L) 05/26/2022 0433   ALKPHOS 35 (L) 05/26/2022 0433   AST 20 05/26/2022 0433   ALT 15 05/26/2022 0433   BILITOT 0.6 05/26/2022 0433       RADIOGRAPHIC STUDIES: I have personally reviewed the radiological images as listed and agreed with the findings in the report. VAS [place] LOWER EXTREMITY VENOUS (DVT) Result Date: 11/26/2023  Lower Venous DVT Study Patient Name:  Carol Rodriguez  Date of Exam:   11/26/2023 Medical Rec #: [id number]       Accession #:    [id number] Date of Birth: 07-Feb-1971        Patient Gender: F Patient Age:   70 years Exam Location:  [facility] Procedure:      VAS [place] LOWER EXTREMITY VENOUS (DVT) Referring Phys: VYVYAN SUN --------------------------------------------------------------------------------  Indications: Pain.  Risk Factors: Per patient hx of Factor V Leiden & hormone therapy. Comparison Study: No previous exams Performing Technologist: Jody Hill RVT, RDMS  Examination Guidelines: A complete evaluation includes B-mode imaging, spectral Doppler, color Doppler, and power Doppler as needed of all accessible portions of each vessel. Bilateral testing is considered an integral part of a complete examination. Limited examinations for reoccurring indications may be performed as  noted. The reflux portion of the exam is performed with the patient in reverse Trendelenburg.  +-----+---------------+---------+-----------+----------+--------------+ RIGHTCompressibilityPhasicitySpontaneityPropertiesThrombus Aging +-----+---------------+---------+-----------+----------+--------------+ CFV  Full           Yes      Yes                                 +-----+---------------+---------+-----------+----------+--------------+   +---------+---------------+---------+-----------+----------+--------------+ LEFT     CompressibilityPhasicitySpontaneityPropertiesThrombus Aging +---------+---------------+---------+-----------+----------+--------------+ CFV      Full           Yes      Yes                                 +---------+---------------+---------+-----------+----------+--------------+ SFJ      Full                                                        +---------+---------------+---------+-----------+----------+--------------+ FV Prox  Full           Yes      Yes                                 +---------+---------------+---------+-----------+----------+--------------+ FV Mid   Full           Yes      Yes                                 +---------+---------------+---------+-----------+----------+--------------+  FV DistalFull           Yes      Yes                                 +---------+---------------+---------+-----------+----------+--------------+ PFV      Full                                                        +---------+---------------+---------+-----------+----------+--------------+ POP      Full           Yes      Yes                                 +---------+---------------+---------+-----------+----------+--------------+ PTV      Full                                                        +---------+---------------+---------+-----------+----------+--------------+ PERO     None           No       No                    Acute          +---------+---------------+---------+-----------+----------+--------------+     Summary: RIGHT: - No evidence of common femoral vein obstruction.   LEFT: - Findings consistent with acute deep vein thrombosis involving the left peroneal veins.  - No cystic structure found in the popliteal fossa.  *See table(s) above for measurements and observations. Electronically signed by Sherald Hess MD on 11/26/2023 at 9:08:54 PM.    Final     All questions were answered. The patient knows to call the clinic with any problems, questions or concerns. I spent 45 minutes in the care of this patient including H and P, review of records, counseling and coordination of care.     Rachel Moulds, MD 12/23/2023 3:53 PM

## 2023-12-23 NOTE — Assessment & Plan Note (Signed)
 Distal Deep Vein Thrombosis (DVT) She experienced a distal DVT in the peroneal vein approximately one month ago, provoked by hormone replacement therapy (HRT) in the context of heterozygous Factor V Leiden mutation. The risk of pulmonary embolism is lower with distal DVTs, but anticoagulation is necessary. She is currently on Eliquis (apixaban) 5 mg twice daily with symptom improvement. She is concerned about the persistence of the clot and has requested a follow-up ultrasound, especially with upcoming flights.  - Continue Eliquis 5 mg twice daily for at least three months. Ideal duration of treatment is 3-6 months provided she discontinues HRT.  - Order vascular ultrasound of the left leg in July to assess the status of the DVT. - Advise wearing compression socks during long flights or car rides. - Discussed the option of continuing Eliquis if she remains on HRT.  Hormone Replacement Therapy (HRT) Use She is on HRT for menopausal symptoms, including hot flashes. There is a family history of blood clots, and she has a heterozygous Factor V Leiden mutation, increasing the risk of thrombosis. She has received conflicting advice regarding the continuation of HRT. Discontinuing HRT is advised to reduce the risk of further clotting events. If she insists on continuing HRT, ongoing anticoagulation with Eliquis is recommended. She is considering the risks and benefits of continuing HRT and is aware of the potential increased risk of breast cancer associated with HRT.  - If she chooses to continue HRT, continue Eliquis indefinitely.  Heterozygous Factor V Leiden Mutation She has a heterozygous Factor V Leiden mutation, which predisposes her to an increased risk of thrombosis. This genetic factor, combined with HRT, likely contributed to the development of the recent DVT. - Consider running a full clotting profile during the next visit to assess for other potential clotting disorders.  We have discussed risks  of recurrence with provoked DVT. She appears to be leaning towards indefinite anticoagulation even if she decided to stop HRT. This is reasonable given her family history I will see her back in 3 months

## 2023-12-24 ENCOUNTER — Other Ambulatory Visit: Payer: Self-pay | Admitting: *Deleted

## 2024-01-01 ENCOUNTER — Encounter: Admitting: Physical Therapy

## 2024-01-27 ENCOUNTER — Encounter: Admitting: Physical Therapy

## 2024-02-03 ENCOUNTER — Ambulatory Visit: Admitting: Physical Therapy

## 2024-02-09 ENCOUNTER — Encounter: Payer: Self-pay | Admitting: Physical Therapy

## 2024-02-09 ENCOUNTER — Ambulatory Visit: Attending: Obstetrics and Gynecology | Admitting: Physical Therapy

## 2024-02-09 DIAGNOSIS — M6281 Muscle weakness (generalized): Secondary | ICD-10-CM | POA: Insufficient documentation

## 2024-02-09 DIAGNOSIS — R279 Unspecified lack of coordination: Secondary | ICD-10-CM | POA: Diagnosis present

## 2024-02-09 DIAGNOSIS — R293 Abnormal posture: Secondary | ICD-10-CM | POA: Insufficient documentation

## 2024-02-09 NOTE — Therapy (Addendum)
 OUTPATIENT PHYSICAL THERAPY FEMALE PELVIC TREATMENT   Patient Name: Carol Rodriguez MRN: [id number] DOB:11-16-1970, 53 y.o., female Today's Date: 02/09/2024  END OF SESSION:  PT End of Session - 02/09/24 1218     Visit Number 2    Date for PT Re-Evaluation 06/19/24    Authorization Type [employer]    PT Start Time 1146    PT Stop Time 1230    PT Time Calculation (min) 44 min    Activity Tolerance Patient tolerated treatment well    Behavior During Therapy WFL for tasks assessed/performed              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    see lov  10-10-2017 dr Tomas Fountain md note in epic, no treatment needed   PMB (postmenopausal bleeding)    Rhabdomyolysis 06/21/2013   arms swelled and acute kidney injury issue resolved   Sleep apnea    mild osa, using oral appliance for teeth grinding   Past Surgical History:  Procedure Laterality Date   colonscopy     2021 or 2022   HYSTEROSCOPY WITH D & C N/A 04/08/2022   Procedure: DILATATION AND CURETTAGE /HYSTEROSCOPY;  Surgeon: Olin Bertin, DO;  Location: [facility];  Service: Gynecology;  Laterality: N/A;   Patient Active Problem List   Diagnosis Date Noted   Acute deep vein thrombosis (DVT) of left peroneal vein (HCC) 11/26/2023   Genetic testing 04/17/2018   Family history of prostate cancer    Family history of breast cancer    Family history of leukemia    Rhabdomyolysis 06/22/2013   Acute kidney injury (HCC) 06/22/2013    PCP: Faustina Hood, MD   REFERRING PROVIDER: Olin Bertin, DO   REFERRING DIAG: M62.9 (ICD-10-CM) - Disorder of muscle, unspecified  THERAPY DIAG:  Muscle weakness (generalized)  Abnormal posture  Unspecified lack of  coordination  Rationale for Evaluation and Treatment: Rehabilitation  ONSET DATE: one year  SUBJECTIVE:                                                                                                                                                                                           SUBJECTIVE STATEMENT: Pt returning to PT after several weeks post eval. Pt reports that she has urge especially at night when  she sweats. Chicken or the egg.  DVT left calf Off hormone therapy  Has a family history of clots, her husband almost died.  Never drinks enough water Goes pee frequently during the day, 2-3 times before she goes to bed.  Pt reports that she has been stressed, getting a new job. Will have insurance through June Is supposed to use CPAP but has TMJ issues    Total liquid (black tea in morning, sparkling water, sometimes alcohol, sometimes Poppi's) usually total 48 oz PAIN:  Are you having pain? No   PRECAUTIONS: None  RED FLAGS: None   WEIGHT BEARING RESTRICTIONS: No  FALLS:  Has patient fallen in last 6 months? No  OCCUPATION: director of [employer]   ACTIVITY LEVEL : walking daily 20-30 mins but not high intensity; does weight lifting 3x weekly   PLOF: Independent  PATIENT GOALS: to have less urine symptoms   PERTINENT HISTORY:  DVT in Lt peroneal vein, does take HRT, HYSTEROSCOPY WITH D & C, Rhabdomyolysis,  Sexual abuse: No  BOWEL MOVEMENT: Pain with bowel movement: No Type of bowel movement:Type (Bristol Stool Scale) 7, Frequency daily, and Strain no Fully empty rectum: No Leakage: No Pads: No Fiber supplement/laxative Yes - Psyllium Husk   URINATION: Pain with urination: No Fully empty bladder: No - unsure Stream: Strong and Weak Urgency: No Frequency: usually 1-2x nightly; 1.5-2 hours daily  Leakage: Urge to void, Coughing, Sneezing, Laughing, Exercise, and Lifting Pads: No  INTERCOURSE:  Ability to have vaginal penetration Yes   Pain with intercourse: none DrynessYes  Climax: not painful  Marinoff Scale: 0/3  PREGNANCY: Vaginal deliveries 2 Tearing Yes: with first needed stitches and reports traumatic, vacuum delivery   C-section deliveries 0 Currently pregnant No  PROLAPSE: None   OBJECTIVE:  Note: Objective measures were completed at Evaluation unless otherwise noted.  DIAGNOSTIC FINDINGS:   COGNITION: Overall cognitive status: Within functional limits for tasks assessed     SENSATION: Light touch: Appears intact   POSTURE: rounded shoulders   LUMBARAROM/PROM:  A/PROM A/PROM  eval  Flexion WFL  Extension WFL  Right lateral flexion WFL  Left lateral flexion WFL  Right rotation WFL  Left rotation WFL   (Blank rows = not tested)  LOWER EXTREMITY ROM:  Bil hips limited by 25% in ROM in bil hamstrings  LOWER EXTREMITY MMT:  Bil hip abduction 3+/5 all others 4/5 PALPATION:   General: tight bil lumbar paraspinals, glute and jaw clenching noted and pt reported  Pelvic Alignment: WFL  Abdominal: no TTP but does have tightness in Rt side of abdominal quadrants                 External Perineal Exam: [facility]                             Internal Pelvic Floor: no TTP, tightness noted in deep layer of pelvic floor bil and superficial  Patient confirms identification and approves PT to assess internal pelvic floor and treatment Yes No emotional/communication barriers or cognitive limitation. Patient is motivated to learn. Patient understands and agrees with treatment goals and plan. PT explains patient will be examined in standing, sitting, and lying down to see how their muscles and joints work. When they are ready, they will be asked to remove their underwear so PT can examine their perineum. The patient is also given the option of providing their own chaperone as one is not  provided in our facility. The patient also has the right and is explained the right to defer or refuse any part of the  evaluation or treatment including the internal exam. With the patient's consent, PT will use one gloved finger to gently assess the muscles of the pelvic floor, seeing how well it contracts and relaxes and if there is muscle symmetry. After, the patient will get dressed and PT and patient will discuss exam findings and plan of care. PT and patient discuss plan of care, schedule, attendance policy and HEP activities.   PELVIC MMT:   MMT eval  Vaginal 3/5; 8s; 6 reps  Internal Anal Sphincter   External Anal Sphincter   Puborectalis   Diastasis Recti   (Blank rows = not tested)        TONE: Slightly increased   PROLAPSE: Not seen in hooklying with cough   TODAY'S TREATMENT:                                                                                                                              DATE:  02/09/24 There acts- TTNS education                      Education on bladder irritants                     Diaphragmatic breathing                     Review of progress, eval, history, goals  E-stim- TTNS 20 mins                        12/18/23 EVAL Examination completed, findings reviewed, pt educated on POC, HEP, and bladder irritants. Pt motivated to participate in PT and agreeable to attempt recommendations.     PATIENT EDUCATION:  Education details: [id number], bladder irritants  Person educated: Patient Education method: Explanation, Demonstration, Tactile cues, Verbal cues, and Handouts Education comprehension: verbalized understanding, returned demonstration, verbal cues required, tactile cues required, and needs further education  HOME EXERCISE PROGRAM: [id number]  ASSESSMENT:  CLINICAL IMPRESSION: Pt did well with trial of TTNS She will do a 2 week water only reset after her vacation.  Pt has been stressed and forgot her exercises here last visit.  She is not constipated now She will try to come to PT more as she can.  She will benefit from PT to address  deficits.    OBJECTIVE IMPAIRMENTS: decreased activity tolerance, decreased coordination, decreased endurance, decreased mobility, decreased strength, increased fascial restrictions, increased muscle spasms, impaired flexibility, improper body mechanics, and postural dysfunction.   ACTIVITY LIMITATIONS: carrying, lifting, squatting, continence, and locomotion level  PARTICIPATION LIMITATIONS: community activity  PERSONAL FACTORS: Time since onset of injury/illness/exacerbation and 1 comorbidity: medical history are also affecting patient's functional outcome.   REHAB POTENTIAL: Good  CLINICAL DECISION MAKING: Stable/uncomplicated  EVALUATION COMPLEXITY:  Low   GOALS: Goals reviewed with patient? Yes  SHORT TERM GOALS: Target date: 01/15/24  Pt to be I with HEP.  Baseline: Goal status: INITIAL  2.  Pt to be I with relaxation techniques to decreased stress and gripping at pelvic floor.  Baseline:  Goal status: INITIAL  3.  Pt to be I with abdominal massage and voiding mechanics for improved bowel type and habits.  Baseline:  Goal status: INITIAL  4.  Pt to be I with knack for decreased urinary incontinence with stressors.  Baseline:  Goal status: INITIAL  LONG TERM GOALS: Target date: 06/19/24  Pt to be I with advanced HEP.  Baseline:  Goal status: INITIAL  2.  Pt to report improved time between bladder voids to at least 3 hours for improved QOL with decreased urinary frequency.   Baseline:  Goal status: INITIAL  3.  Pt to demonstrate improved coordination of pelvic floor and breathing mechanics with 20# squat with appropriate synergistic patterns to decrease pain and leakage at least 75% of the time.    Baseline:  Goal status: INITIAL  4.  Pt report at least stool type 5 with 75% of bowel movement for improved stool stype on Bristol scale and decreased fecal urgency.  Baseline:  Goal status: INITIAL  5.  Pt to demonstrate at least 5/5 bil hip strength for improved  pelvic stability and functional squats without leakage.  Baseline:  Goal status: INITIAL   6.  Pt to report no more than 1 urinary incontinence instance in a week for improved confidence with working out Baseline:  Goal status: INITIAL   PLAN:  PT FREQUENCY: 1x/week  PT DURATION: 8 sessions  PLANNED INTERVENTIONS: 97110-Therapeutic exercises, 97530- Therapeutic activity, 97112- Neuromuscular re-education, 97535- Self Care, [id number]- Manual therapy, Patient/Family education, Taping, Dry Needling, Joint mobilization, Spinal mobilization, Scar mobilization, DME instructions, Cryotherapy, Moist heat, and Biofeedback  PLAN FOR NEXT SESSION: core and hip strengthening with pelvic floor/breathing coordination, knack, urge drill  Winnie Umali, PT 02/09/24 4:22 PM

## 2024-02-24 ENCOUNTER — Ambulatory Visit: Attending: Obstetrics and Gynecology | Admitting: Physical Therapy

## 2024-02-24 DIAGNOSIS — M6281 Muscle weakness (generalized): Secondary | ICD-10-CM | POA: Insufficient documentation

## 2024-02-24 DIAGNOSIS — R293 Abnormal posture: Secondary | ICD-10-CM | POA: Diagnosis present

## 2024-02-24 DIAGNOSIS — R279 Unspecified lack of coordination: Secondary | ICD-10-CM | POA: Insufficient documentation

## 2024-02-24 NOTE — Therapy (Signed)
 OUTPATIENT PHYSICAL THERAPY FEMALE PELVIC TREATMENT   Patient Name: Carol Rodriguez MRN: [id number] DOB:1971/04/24, 53 y.o., female Today's Date: 02/24/2024  END OF SESSION:  PT End of Session - 02/24/24 1149     Visit Number 3    Date for PT Re-Evaluation 06/19/24    Authorization Type [employer]    PT Start Time 1147    PT Stop Time 1226    PT Time Calculation (min) 39 min    Activity Tolerance Patient tolerated treatment well    Behavior During Therapy WFL for tasks assessed/performed              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    see lov  10-10-2017 dr Tomas Fountain md note in epic, no treatment needed   PMB (postmenopausal bleeding)    Rhabdomyolysis 06/21/2013   arms swelled and acute kidney injury issue resolved   Sleep apnea    mild osa, using oral appliance for teeth grinding   Past Surgical History:  Procedure Laterality Date   colonscopy     2021 or 2022   HYSTEROSCOPY WITH D & C N/A 04/08/2022   Procedure: DILATATION AND CURETTAGE /HYSTEROSCOPY;  Surgeon: Olin Bertin, DO;  [facility];  Service: Gynecology;  Laterality: N/A;   Patient Active Problem List   Diagnosis Date Noted   Acute deep vein thrombosis (DVT) of left peroneal vein (HCC) 11/26/2023   Genetic testing 04/17/2018   Family history of prostate cancer    Family history of breast cancer    Family history of leukemia    Rhabdomyolysis 06/22/2013   Acute kidney injury (HCC) 06/22/2013    PCP: Faustina Hood, MD   REFERRING PROVIDER: Olin Bertin, DO   REFERRING DIAG: M62.9 (ICD-10-CM) - Disorder of muscle, unspecified  THERAPY DIAG:  Muscle weakness (generalized)  Abnormal posture  Unspecified lack of  coordination  Rationale for Evaluation and Treatment: Rehabilitation  ONSET DATE: one year  SUBJECTIVE:                                                                                                                                                                                           SUBJECTIVE STATEMENT: Went on a cruise and unable to keep up with recommendations and wants to try only water for 2  weeks now.     Total liquid (black tea in morning, sparkling water, sometimes alcohol, sometimes Poppi's) usually total 48 oz PAIN:  Are you having pain? No   PRECAUTIONS: None  RED FLAGS: None   WEIGHT BEARING RESTRICTIONS: No  FALLS:  Has patient fallen in last 6 months? No  OCCUPATION: director of [employer]   ACTIVITY LEVEL : walking daily 20-30 mins but not high intensity; does weight lifting 3x weekly   PLOF: Independent  PATIENT GOALS: to have less urine symptoms   PERTINENT HISTORY:  DVT in Lt peroneal vein, does take HRT, HYSTEROSCOPY WITH D & C, Rhabdomyolysis,  Sexual abuse: No  BOWEL MOVEMENT: Pain with bowel movement: No Type of bowel movement:Type (Bristol Stool Scale) 7, Frequency daily, and Strain no Fully empty rectum: No Leakage: No Pads: No Fiber supplement/laxative Yes - Psyllium Husk   URINATION: Pain with urination: No Fully empty bladder: No - unsure Stream: Strong and Weak Urgency: No Frequency: usually 1-2x nightly; 1.5-2 hours daily  Leakage: Urge to void, Coughing, Sneezing, Laughing, Exercise, and Lifting Pads: No  INTERCOURSE:  Ability to have vaginal penetration Yes  Pain with intercourse: none DrynessYes  Climax: not painful  Marinoff Scale: 0/3  PREGNANCY: Vaginal deliveries 2 Tearing Yes: with first needed stitches and reports traumatic, vacuum delivery   C-section deliveries 0 Currently pregnant No  PROLAPSE: None   OBJECTIVE:  Note: Objective measures were completed at Evaluation unless otherwise  noted.  DIAGNOSTIC FINDINGS:   COGNITION: Overall cognitive status: Within functional limits for tasks assessed     SENSATION: Light touch: Appears intact   POSTURE: rounded shoulders   LUMBARAROM/PROM:  A/PROM A/PROM  eval  Flexion WFL  Extension WFL  Right lateral flexion WFL  Left lateral flexion WFL  Right rotation WFL  Left rotation WFL   (Blank rows = not tested)  LOWER EXTREMITY ROM:  Bil hips limited by 25% in ROM in bil hamstrings  LOWER EXTREMITY MMT:  Bil hip abduction 3+/5 all others 4/5 PALPATION:   General: tight bil lumbar paraspinals, glute and jaw clenching noted and pt reported  Pelvic Alignment: WFL  Abdominal: no TTP but does have tightness in Rt side of abdominal quadrants                 External Perineal Exam: [facility]                             Internal Pelvic Floor: no TTP, tightness noted in deep layer of pelvic floor bil and superficial  Patient confirms identification and approves PT to assess internal pelvic floor and treatment Yes No emotional/communication barriers or cognitive limitation. Patient is motivated to learn. Patient understands and agrees with treatment goals and plan. PT explains patient will be examined in standing, sitting, and lying down to see how their muscles and joints work. When they are ready, they will be asked to remove their underwear so PT can examine their perineum. The patient is also given the option of providing their own chaperone as one is not provided in our facility. The patient also has the right and is explained the right to defer or refuse any part of the evaluation or treatment including the internal exam. With the patient's consent, PT will use one gloved finger to gently assess the muscles of the pelvic floor, seeing how well it contracts and relaxes and if there is muscle symmetry. After, the patient  will get dressed and PT and patient will discuss exam findings and plan of care. PT and patient discuss  plan of care, schedule, attendance policy and HEP activities.   PELVIC MMT:   MMT eval  Vaginal 3/5; 8s; 6 reps  Internal Anal Sphincter   External Anal Sphincter   Puborectalis   Diastasis Recti   (Blank rows = not tested)        TONE: Slightly increased   PROLAPSE: Not seen in hooklying with cough   TODAY'S TREATMENT:                                                                                                                              DATE:    12/18/23 EVAL Examination completed, findings reviewed, pt educated on POC, HEP, and bladder irritants. Pt motivated to participate in PT and agreeable to attempt recommendations.     02/09/24 There acts- TTNS education                      Education on bladder irritants                     Diaphragmatic breathing                     Review of progress, eval, history, goals  E-stim- TTNS 20 mins                    02/24/24: Hooklying opp hand/knee ball press 2x10 2x10 bridges with exhale and pelvic floor contraction 2x10 sidelying ball press with hip abduction Seated blue band bil shoulder horizontal abduction with transverse abdominis activation, exhale, pelvic floor activation 2x10 2x10 10# Squats with pelvic floor contraction and exhale Farmer's carry 10#>15# 750' each hand Standing marching with 10# iso at chest height 75% extended 2x10 Pt educated on urge drill and hand out given     PATIENT EDUCATION:  Education details: [id number], bladder irritants  Person educated: Patient Education method: Explanation, Demonstration, Tactile cues, Verbal cues, and Handouts Education comprehension: verbalized understanding, returned demonstration, verbal cues required, tactile cues required, and needs further education  HOME EXERCISE PROGRAM: [id number]  ASSESSMENT:  CLINICAL IMPRESSION: Pt presents for treatment, tolerated session well with focus being coordination of pelvic floor with breathing mechanics with core/hip  strengthening exercises. Pt benefited from moderate verbal cues and visual demonstration for this coordination and modifications for techniques to insure proper techniques for optimal strength gains. Pt declined all leakage during, tolerated well. Pt reports she will attempt 2 weeks of decreased bladder irritants. Pt would benefit from additional PT to further address deficits.     OBJECTIVE IMPAIRMENTS: decreased activity tolerance, decreased coordination, decreased endurance, decreased mobility, decreased strength, increased fascial restrictions, increased muscle spasms, impaired flexibility, improper body mechanics, and postural dysfunction.   ACTIVITY LIMITATIONS: carrying, lifting, squatting, continence, and locomotion level  PARTICIPATION LIMITATIONS: community activity  PERSONAL FACTORS: Time since  onset of injury/illness/exacerbation and 1 comorbidity: medical history are also affecting patient's functional outcome.   REHAB POTENTIAL: Good  CLINICAL DECISION MAKING: Stable/uncomplicated  EVALUATION COMPLEXITY: Low   GOALS: Goals reviewed with patient? Yes  SHORT TERM GOALS: Target date: 01/15/24  Pt to be I with HEP.  Baseline: Goal status: INITIAL  2.  Pt to be I with relaxation techniques to decreased stress and gripping at pelvic floor.  Baseline:  Goal status: INITIAL  3.  Pt to be I with abdominal massage and voiding mechanics for improved bowel type and habits.  Baseline:  Goal status: INITIAL  4.  Pt to be I with knack for decreased urinary incontinence with stressors.  Baseline:  Goal status: INITIAL  LONG TERM GOALS: Target date: 06/19/24  Pt to be I with advanced HEP.  Baseline:  Goal status: INITIAL  2.  Pt to report improved time between bladder voids to at least 3 hours for improved QOL with decreased urinary frequency.   Baseline:  Goal status: INITIAL  3.  Pt to demonstrate improved coordination of pelvic floor and breathing mechanics with 20#  squat with appropriate synergistic patterns to decrease pain and leakage at least 75% of the time.    Baseline:  Goal status: INITIAL  4.  Pt report at least stool type 5 with 75% of bowel movement for improved stool stype on Bristol scale and decreased fecal urgency.  Baseline:  Goal status: INITIAL  5.  Pt to demonstrate at least 5/5 bil hip strength for improved pelvic stability and functional squats without leakage.  Baseline:  Goal status: INITIAL   6.  Pt to report no more than 1 urinary incontinence instance in a week for improved confidence with working out Baseline:  Goal status: INITIAL   PLAN:  PT FREQUENCY: 1x/week  PT DURATION: 8 sessions  PLANNED INTERVENTIONS: 97110-Therapeutic exercises, 97530- Therapeutic activity, 97112- Neuromuscular re-education, 97535- Self Care, 29562- Manual therapy, Patient/Family education, Taping, Dry Needling, Joint mobilization, Spinal mobilization, Scar mobilization, DME instructions, Cryotherapy, Moist heat, and Biofeedback  PLAN FOR NEXT SESSION: core and hip strengthening with pelvic floor/breathing coordination, knack, urge drill  Avie Lemme, PT, DPT 06/03/251:14 PM

## 2024-02-24 NOTE — Patient Instructions (Signed)

## 2024-03-02 ENCOUNTER — Ambulatory Visit: Admitting: Physical Therapy

## 2024-03-02 DIAGNOSIS — M6281 Muscle weakness (generalized): Secondary | ICD-10-CM

## 2024-03-02 DIAGNOSIS — R279 Unspecified lack of coordination: Secondary | ICD-10-CM

## 2024-03-02 DIAGNOSIS — R293 Abnormal posture: Secondary | ICD-10-CM

## 2024-03-02 NOTE — Therapy (Signed)
 OUTPATIENT PHYSICAL THERAPY FEMALE PELVIC TREATMENT   Patient Name: Carol Rodriguez MRN: [id number] DOB:September 22, 1971, 53 y.o., female Today's Date: 03/02/2024  END OF SESSION:  PT End of Session - 03/02/24 1147     Visit Number 4    Date for PT Re-Evaluation 06/19/24    Authorization Type [employer]    PT Start Time 1146    PT Stop Time 1224    PT Time Calculation (min) 38 min    Activity Tolerance Patient tolerated treatment well    Behavior During Therapy WFL for tasks assessed/performed              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    see lov  10-10-2017 dr Tomas Fountain md note in epic, no treatment needed   PMB (postmenopausal bleeding)    Rhabdomyolysis 06/21/2013   arms swelled and acute kidney injury issue resolved   Sleep apnea    mild osa, using oral appliance for teeth grinding   Past Surgical History:  Procedure Laterality Date   colonscopy     2021 or 2022   HYSTEROSCOPY WITH D & C N/A 04/08/2022   Procedure: DILATATION AND CURETTAGE /HYSTEROSCOPY;  Surgeon: Olin Bertin, DO;  Location: [facility];  Service: Gynecology;  Laterality: N/A;   Patient Active Problem List   Diagnosis Date Noted   Acute deep vein thrombosis (DVT) of left peroneal vein (HCC) 11/26/2023   Genetic testing 04/17/2018   Family history of prostate cancer    Family history of breast cancer    Family history of leukemia    Rhabdomyolysis 06/22/2013   Acute kidney injury (HCC) 06/22/2013    PCP: Faustina Hood, MD   REFERRING PROVIDER: Olin Bertin, DO   REFERRING DIAG: M62.9 (ICD-10-CM) - Disorder of muscle, unspecified  THERAPY DIAG:  Muscle weakness (generalized)  Abnormal posture  Unspecified lack of  coordination  Rationale for Evaluation and Treatment: Rehabilitation  ONSET DATE: one year  SUBJECTIVE:                                                                                                                                                                                           SUBJECTIVE STATEMENT: Has had a lot less frequency with cutting irritants, doing urge drill but fearful of not having a bathroom  and goes.   Total liquid (black tea in morning, sparkling water, sometimes alcohol, sometimes Poppi's) usually total 48 oz PAIN:  Are you having pain? No   PRECAUTIONS: None  RED FLAGS: None   WEIGHT BEARING RESTRICTIONS: No  FALLS:  Has patient fallen in last 6 months? No  OCCUPATION: director of [employer]   ACTIVITY LEVEL : walking daily 20-30 mins but not high intensity; does weight lifting 3x weekly   PLOF: Independent  PATIENT GOALS: to have less urine symptoms   PERTINENT HISTORY:  DVT in Lt peroneal vein, does take HRT, HYSTEROSCOPY WITH D & C, Rhabdomyolysis,  Sexual abuse: No  BOWEL MOVEMENT: Pain with bowel movement: No Type of bowel movement:Type (Bristol Stool Scale) 7, Frequency daily, and Strain no Fully empty rectum: No Leakage: No Pads: No Fiber supplement/laxative Yes - Psyllium Husk   URINATION: Pain with urination: No Fully empty bladder: No - unsure Stream: Strong and Weak Urgency: No Frequency: usually 1-2x nightly; 1.5-2 hours daily  Leakage: Urge to void, Coughing, Sneezing, Laughing, Exercise, and Lifting Pads: No  INTERCOURSE:  Ability to have vaginal penetration Yes  Pain with intercourse: none DrynessYes  Climax: not painful  Marinoff Scale: 0/3  PREGNANCY: Vaginal deliveries 2 Tearing Yes: with first needed stitches and reports traumatic, vacuum delivery   C-section deliveries 0 Currently pregnant No  PROLAPSE: None   OBJECTIVE:  Note: Objective measures were completed at Evaluation unless  otherwise noted.  DIAGNOSTIC FINDINGS:   COGNITION: Overall cognitive status: Within functional limits for tasks assessed     SENSATION: Light touch: Appears intact   POSTURE: rounded shoulders   LUMBARAROM/PROM:  A/PROM A/PROM  eval  Flexion WFL  Extension WFL  Right lateral flexion WFL  Left lateral flexion WFL  Right rotation WFL  Left rotation WFL   (Blank rows = not tested)  LOWER EXTREMITY ROM:  Bil hips limited by 25% in ROM in bil hamstrings  LOWER EXTREMITY MMT:  Bil hip abduction 3+/5 all others 4/5 PALPATION:   General: tight bil lumbar paraspinals, glute and jaw clenching noted and pt reported  Pelvic Alignment: WFL  Abdominal: no TTP but does have tightness in Rt side of abdominal quadrants                 External Perineal Exam: [facility]                             Internal Pelvic Floor: no TTP, tightness noted in deep layer of pelvic floor bil and superficial  Patient confirms identification and approves PT to assess internal pelvic floor and treatment Yes No emotional/communication barriers or cognitive limitation. Patient is motivated to learn. Patient understands and agrees with treatment goals and plan. PT explains patient will be examined in standing, sitting, and lying down to see how their muscles and joints work. When they are ready, they will be asked to remove their underwear so PT can examine their perineum. The patient is also given the option of providing their own chaperone as one is not provided in our facility. The patient also has the right and is explained the right to defer or refuse any part of the evaluation or treatment including the internal exam. With the patient's consent, PT will use one gloved finger to gently assess the muscles of the pelvic floor, seeing how well it contracts and relaxes and if there is muscle symmetry. After, the patient will get  dressed and PT and patient will discuss exam findings and plan of care. PT and patient  discuss plan of care, schedule, attendance policy and HEP activities.   PELVIC MMT:   MMT eval  Vaginal 3/5; 8s; 6 reps  Internal Anal Sphincter   External Anal Sphincter   Puborectalis   Diastasis Recti   (Blank rows = not tested)        TONE: Slightly increased   PROLAPSE: Not seen in hooklying with cough   TODAY'S TREATMENT:                                                                                                                              DATE:    12/18/23 EVAL Examination completed, findings reviewed, pt educated on POC, HEP, and bladder irritants. Pt motivated to participate in PT and agreeable to attempt recommendations.     02/09/24 There acts- TTNS education                      Education on bladder irritants                     Diaphragmatic breathing                     Review of progress, eval, history, goals  E-stim- TTNS 20 mins                    02/24/24: Hooklying opp hand/knee ball press 2x10 2x10 bridges with exhale and pelvic floor contraction 2x10 sidelying ball press with hip abduction Seated blue band bil shoulder horizontal abduction with transverse abdominis activation, exhale, pelvic floor activation 2x10 2x10 10# Squats with pelvic floor contraction and exhale Farmer's carry 10#>15# 750' each hand Standing marching with 10# iso at chest height 75% extended 2x10 Pt educated on urge drill and hand out given   03/02/24: Reviewed bladder retraining techniques and urge drill in case specific ways Hooklying transverse abdominis + pelvic floor contraction and exhale 2x10 Hooklying transverse abdominis with bil shoulder horizontal abduction +pelvic floor contraction and exhale 2x10 Hooklying hip abduction blue band 2x10 +pelvic floor contraction and exhale 2x10 sidelying ball press with hip abduction 10# iso chest height (50% out), marching 2x10 Farmer's carry 15# 1000' each hand 3# standing hip abduction, flexion, ext 2x10 15# dead lifts  2x10   PATIENT EDUCATION:  Education details: [id number], bladder irritants  Person educated: Patient Education method: Explanation, Demonstration, Tactile cues, Verbal cues, and Handouts Education comprehension: verbalized understanding, returned demonstration, verbal cues required, tactile cues required, and needs further education  HOME EXERCISE PROGRAM: [id number]  ASSESSMENT:  CLINICAL IMPRESSION: Pt presents for treatment, tolerated session well with focus being coordination of pelvic floor with breathing mechanics with core/hip strengthening exercises. Pt benefited from moderate verbal cues and visual demonstration for this coordination and modifications for techniques to insure proper techniques for optimal strength gains. Pt declined all  leakage during, tolerated well.  Has seen improvement with cutting carbonated drinks and decreased frequency. Pt would benefit from additional PT to further address deficits.     OBJECTIVE IMPAIRMENTS: decreased activity tolerance, decreased coordination, decreased endurance, decreased mobility, decreased strength, increased fascial restrictions, increased muscle spasms, impaired flexibility, improper body mechanics, and postural dysfunction.   ACTIVITY LIMITATIONS: carrying, lifting, squatting, continence, and locomotion level  PARTICIPATION LIMITATIONS: community activity  PERSONAL FACTORS: Time since onset of injury/illness/exacerbation and 1 comorbidity: medical history are also affecting patient's functional outcome.   REHAB POTENTIAL: Good  CLINICAL DECISION MAKING: Stable/uncomplicated  EVALUATION COMPLEXITY: Low   GOALS: Goals reviewed with patient? Yes  SHORT TERM GOALS: Target date: 01/15/24  Pt to be I with HEP.  Baseline: Goal status: MET  2.  Pt to be I with relaxation techniques to decreased stress and gripping at pelvic floor.  Baseline:  Goal status: MET  3.  Pt to be I with abdominal massage and voiding mechanics for  improved bowel type and habits.  Baseline:  Goal status: on going  4.  Pt to be I with knack for decreased urinary incontinence with stressors.  Baseline:  Goal status: MET  LONG TERM GOALS: Target date: 06/19/24  Pt to be I with advanced HEP.  Baseline:  Goal status: on going  2.  Pt to report improved time between bladder voids to at least 3 hours for improved QOL with decreased urinary frequency.   Baseline:  Goal status: MET  3.  Pt to demonstrate improved coordination of pelvic floor and breathing mechanics with 20# squat with appropriate synergistic patterns to decrease pain and leakage at least 75% of the time.    Baseline:  Goal status: on going  4.  Pt report at least stool type 5 with 75% of bowel movement for improved stool stype on Bristol scale and decreased fecal urgency.  Baseline:  Goal status: on going  5.  Pt to demonstrate at least 5/5 bil hip strength for improved pelvic stability and functional squats without leakage.  Baseline:  Goal status: on going   6.  Pt to report no more than 1 urinary incontinence instance in a week for improved confidence with working out Baseline:  Goal status: on going   PLAN:  PT FREQUENCY: 1x/week  PT DURATION: 8 sessions  PLANNED INTERVENTIONS: 97110-Therapeutic exercises, 97530- Therapeutic activity, 97112- Neuromuscular re-education, 97535- Self Care, [id number]- Manual therapy, Patient/Family education, Taping, Dry Needling, Joint mobilization, Spinal mobilization, Scar mobilization, DME instructions, Cryotherapy, Moist heat, and Biofeedback  PLAN FOR NEXT SESSION: core and hip strengthening with pelvic floor/breathing coordination, knack, urge drill  Avie Lemme, PT, DPT 03/03/2511:26 PM

## 2024-03-09 ENCOUNTER — Ambulatory Visit: Admitting: Physical Therapy

## 2024-03-09 ENCOUNTER — Encounter: Payer: Self-pay | Admitting: Hematology and Oncology

## 2024-03-09 DIAGNOSIS — R293 Abnormal posture: Secondary | ICD-10-CM

## 2024-03-09 DIAGNOSIS — R279 Unspecified lack of coordination: Secondary | ICD-10-CM

## 2024-03-09 DIAGNOSIS — M6281 Muscle weakness (generalized): Secondary | ICD-10-CM

## 2024-03-09 NOTE — Therapy (Signed)
 OUTPATIENT PHYSICAL THERAPY FEMALE PELVIC TREATMENT   Patient Name: Carol Rodriguez MRN: [id number] DOB:Mar 11, 1971, 53 y.o., female Today's Date: 03/09/2024  END OF SESSION:  PT End of Session - 03/09/24 1150     Visit Number 5    Date for PT Re-Evaluation 06/19/24    Authorization Type [employer]    PT Start Time 1146    PT Stop Time 1227    PT Time Calculation (min) 41 min    Activity Tolerance Patient tolerated treatment well    Behavior During Therapy WFL for tasks assessed/performed           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    see lov  10-10-2017 dr Tomas Fountain md note in epic, no treatment needed   PMB (postmenopausal bleeding)    Rhabdomyolysis 06/21/2013   arms swelled and acute kidney injury issue resolved   Sleep apnea    mild osa, using oral appliance for teeth grinding   Past Surgical History:  Procedure Laterality Date   colonscopy     2021 or 2022   HYSTEROSCOPY WITH D & C N/A 04/08/2022   Procedure: DILATATION AND CURETTAGE /HYSTEROSCOPY;  Surgeon: Olin Bertin, DO;  Location: [facility];  Service: Gynecology;  Laterality: N/A;   Patient Active Problem List   Diagnosis Date Noted   Acute deep vein thrombosis (DVT) of left peroneal vein (HCC) 11/26/2023   Genetic testing 04/17/2018   Family history of prostate cancer    Family history of breast cancer    Family history of leukemia    Rhabdomyolysis 06/22/2013   Acute kidney injury (HCC) 06/22/2013    PCP: Faustina Hood, MD   REFERRING PROVIDER: Olin Bertin, DO   REFERRING DIAG: M62.9 (ICD-10-CM) - Disorder of muscle, unspecified  THERAPY DIAG:  Muscle weakness (generalized)  Abnormal posture  Unspecified lack of  coordination  Rationale for Evaluation and Treatment: Rehabilitation  ONSET DATE: one year  SUBJECTIVE:                                                                                                                                                                                           SUBJECTIVE STATEMENT: Has seen improvement with urge drill and decreasing irritants - now more 2-3 hours unless irritants drank then sooner   Total  liquid (black tea in morning, sparkling water, sometimes alcohol, sometimes Poppi's) usually total 48 oz PAIN:  Are you having pain? No   PRECAUTIONS: None  RED FLAGS: None   WEIGHT BEARING RESTRICTIONS: No  FALLS:  Has patient fallen in last 6 months? No  OCCUPATION: director of [employer]   ACTIVITY LEVEL : walking daily 20-30 mins but not high intensity; does weight lifting 3x weekly   PLOF: Independent  PATIENT GOALS: to have less urine symptoms   PERTINENT HISTORY:  DVT in Lt peroneal vein, does take HRT, HYSTEROSCOPY WITH D & C, Rhabdomyolysis,  Sexual abuse: No  BOWEL MOVEMENT: Pain with bowel movement: No Type of bowel movement:Type (Bristol Stool Scale) 7, Frequency daily, and Strain no Fully empty rectum: No Leakage: No Pads: No Fiber supplement/laxative Yes - Psyllium Husk   URINATION: Pain with urination: No Fully empty bladder: No - unsure Stream: Strong and Weak Urgency: No Frequency: usually 1-2x nightly; 1.5-2 hours daily  Leakage: Urge to void, Coughing, Sneezing, Laughing, Exercise, and Lifting Pads: No  INTERCOURSE:  Ability to have vaginal penetration Yes  Pain with intercourse: none DrynessYes  Climax: not painful  Marinoff Scale: 0/3  PREGNANCY: Vaginal deliveries 2 Tearing Yes: with first needed stitches and reports traumatic, vacuum delivery   C-section deliveries 0 Currently pregnant No  PROLAPSE: None   OBJECTIVE:  Note: Objective measures were completed at Evaluation unless  otherwise noted.  DIAGNOSTIC FINDINGS:   COGNITION: Overall cognitive status: Within functional limits for tasks assessed     SENSATION: Light touch: Appears intact   POSTURE: rounded shoulders   LUMBARAROM/PROM:  A/PROM A/PROM  eval  Flexion WFL  Extension WFL  Right lateral flexion WFL  Left lateral flexion WFL  Right rotation WFL  Left rotation WFL   (Blank rows = not tested)  LOWER EXTREMITY ROM:  Bil hips limited by 25% in ROM in bil hamstrings  LOWER EXTREMITY MMT:  Bil hip abduction 3+/5 all others 4/5 PALPATION:   General: tight bil lumbar paraspinals, glute and jaw clenching noted and pt reported  Pelvic Alignment: WFL  Abdominal: no TTP but does have tightness in Rt side of abdominal quadrants                 External Perineal Exam: [facility]                             Internal Pelvic Floor: no TTP, tightness noted in deep layer of pelvic floor bil and superficial  Patient confirms identification and approves PT to assess internal pelvic floor and treatment Yes No emotional/communication barriers or cognitive limitation. Patient is motivated to learn. Patient understands and agrees with treatment goals and plan. PT explains patient will be examined in standing, sitting, and lying down to see how their muscles and joints work. When they are ready, they will be asked to remove their underwear so PT can examine their perineum. The patient is also given the option of providing their own chaperone as one is not provided in our facility. The patient also has the right and is explained the right to defer or refuse any part of the evaluation or treatment including the internal exam. With the patient's consent, PT will use one gloved finger to gently assess the muscles of the pelvic floor, seeing how well it contracts and relaxes and if there is muscle symmetry. After, the patient will get dressed and PT and patient  will discuss exam findings and plan of care. PT and patient  discuss plan of care, schedule, attendance policy and HEP activities.   PELVIC MMT:   MMT eval  Vaginal 3/5; 8s; 6 reps  Internal Anal Sphincter   External Anal Sphincter   Puborectalis   Diastasis Recti   (Blank rows = not tested)        TONE: Slightly increased   PROLAPSE: Not seen in hooklying with cough   TODAY'S TREATMENT:                                                                                                                              DATE:                    02/24/24: Hooklying opp hand/knee ball press 2x10 2x10 bridges with exhale and pelvic floor contraction 2x10 sidelying ball press with hip abduction Seated blue band bil shoulder horizontal abduction with transverse abdominis activation, exhale, pelvic floor activation 2x10 2x10 10# Squats with pelvic floor contraction and exhale Farmer's carry 10#>15# 750' each hand Standing marching with 10# iso at chest height 75% extended 2x10 Pt educated on urge drill and hand out given   03/02/24: Reviewed bladder retraining techniques and urge drill in case specific ways Hooklying transverse abdominis + pelvic floor contraction and exhale 2x10 Hooklying transverse abdominis with bil shoulder horizontal abduction +pelvic floor contraction and exhale 2x10 Hooklying hip abduction blue band 2x10 +pelvic floor contraction and exhale 2x10 sidelying ball press with hip abduction 10# iso chest height (50% out), marching 2x10 Farmer's carry 15# 1000' each hand 3# standing hip abduction, flexion, ext 2x10 15# dead lifts 2x10  03/17/2024: Hooklying black loop 2x10 abduction +pelvic floor contraction Bird dogs 2x10 Fire hydrants 2x10 each + pelvic floor contraction Standing bil shoulder horizontal abduction green band 2x10  15# same side single leg dead lift x10 each Farmers carry 20# 1000' each hand Open books x10 each side Hamstring stretch 2x30s each    PATIENT EDUCATION:  Education details: [id number], bladder  irritants  Person educated: Patient Education method: Explanation, Demonstration, Tactile cues, Verbal cues, and Handouts Education comprehension: verbalized understanding, returned demonstration, verbal cues required, tactile cues required, and needs further education  HOME EXERCISE PROGRAM: [id number]  ASSESSMENT:  CLINICAL IMPRESSION: Pt presents for treatment, tolerated session well with focus being coordination of pelvic floor with breathing mechanics with core/hip strengthening exercises. Pt benefited from minimal verbal cues for this coordination and modifications for techniques to insure proper techniques for optimal strength gains. Pt declined all leakage during, tolerated well.  Has seen improvement with cutting carbonated drinks and decreased frequency. Pt would benefit from additional PT to further address deficits.     OBJECTIVE IMPAIRMENTS: decreased activity tolerance, decreased coordination, decreased endurance, decreased mobility, decreased strength, increased fascial restrictions, increased muscle spasms, impaired flexibility, improper body mechanics, and postural dysfunction.   ACTIVITY LIMITATIONS: carrying, lifting, squatting, continence, and locomotion level  PARTICIPATION LIMITATIONS: community activity  PERSONAL FACTORS: Time since onset of injury/illness/exacerbation and 1 comorbidity: medical history are also affecting patient's functional outcome.   REHAB POTENTIAL: Good  CLINICAL DECISION MAKING: Stable/uncomplicated  EVALUATION COMPLEXITY: Low   GOALS: Goals reviewed with patient? Yes  SHORT TERM GOALS: Target date: 01/15/24  Pt to be I with HEP.  Baseline: Goal status: MET  2.  Pt to be I with relaxation techniques to decreased stress and gripping at pelvic floor.  Baseline:  Goal status: MET  3.  Pt to be I with abdominal massage and voiding mechanics for improved bowel type and habits.  Baseline:  Goal status: on going  4.  Pt to be I with  knack for decreased urinary incontinence with stressors.  Baseline:  Goal status: MET  LONG TERM GOALS: Target date: 06/19/24  Pt to be I with advanced HEP.  Baseline:  Goal status: on going  2.  Pt to report improved time between bladder voids to at least 3 hours for improved QOL with decreased urinary frequency.   Baseline:  Goal status: MET  3.  Pt to demonstrate improved coordination of pelvic floor and breathing mechanics with 20# squat with appropriate synergistic patterns to decrease pain and leakage at least 75% of the time.    Baseline:  Goal status: on going  4.  Pt report at least stool type 5 with 75% of bowel movement for improved stool stype on Bristol scale and decreased fecal urgency.  Baseline:  Goal status: on going  5.  Pt to demonstrate at least 5/5 bil hip strength for improved pelvic stability and functional squats without leakage.  Baseline:  Goal status: on going   6.  Pt to report no more than 1 urinary incontinence instance in a week for improved confidence with working out Baseline:  Goal status: on going   PLAN:  PT FREQUENCY: 1x/week  PT DURATION: 8 sessions  PLANNED INTERVENTIONS: 97110-Therapeutic exercises, 97530- Therapeutic activity, 97112- Neuromuscular re-education, 97535- Self Care, 82956- Manual therapy, Patient/Family education, Taping, Dry Needling, Joint mobilization, Spinal mobilization, Scar mobilization, DME instructions, Cryotherapy, Moist heat, and Biofeedback  PLAN FOR NEXT SESSION: core and hip strengthening with pelvic floor/breathing coordination, knack, urge drill  Avie Lemme, PT, DPT 03/10/2511:27 PM

## 2024-03-16 ENCOUNTER — Ambulatory Visit: Admitting: Physical Therapy

## 2024-03-16 ENCOUNTER — Encounter: Payer: Self-pay | Admitting: Physical Therapy

## 2024-03-16 DIAGNOSIS — R293 Abnormal posture: Secondary | ICD-10-CM

## 2024-03-16 DIAGNOSIS — R279 Unspecified lack of coordination: Secondary | ICD-10-CM

## 2024-03-16 DIAGNOSIS — M6281 Muscle weakness (generalized): Secondary | ICD-10-CM | POA: Diagnosis not present

## 2024-03-16 NOTE — Therapy (Signed)
 OUTPATIENT PHYSICAL THERAPY FEMALE PELVIC TREATMENT   Patient Name: Carol Rodriguez MRN: [id number] DOB:05-15-1971, 53 y.o., female Today's Date: 03/16/2024  END OF SESSION:  PT End of Session - 03/16/24 1145     Visit Number 6    Date for PT Re-Evaluation 06/19/24    Authorization Type [employer]    PT Start Time 1145    PT Stop Time 1230    PT Time Calculation (min) 45 min    Activity Tolerance Patient tolerated treatment well    Behavior During Therapy WFL for tasks assessed/performed            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    see lov  10-10-2017 dr floria monte md note in epic, no treatment needed   PMB (postmenopausal bleeding)    Rhabdomyolysis 06/21/2013   arms swelled and acute kidney injury issue resolved   Sleep apnea    mild osa, using oral appliance for teeth grinding   Past Surgical History:  Procedure Laterality Date   colonscopy     2021 or 2022   HYSTEROSCOPY WITH D & C N/A 04/08/2022   Procedure: DILATATION AND CURETTAGE /HYSTEROSCOPY;  Surgeon: Rendell Calton LABOR, DO;  Location: [facility];  Service: Gynecology;  Laterality: N/A;   Patient Active Problem List   Diagnosis Date Noted   Acute deep vein thrombosis (DVT) of left peroneal vein (HCC) 11/26/2023   Genetic testing 04/17/2018   Family history of prostate cancer    Family history of breast cancer    Family history of leukemia    Rhabdomyolysis 06/22/2013   Acute kidney injury (HCC) 06/22/2013    PCP: Claudene Pellet, MD   REFERRING PROVIDER: Rendell Calton LABOR, DO   REFERRING DIAG: M62.9 (ICD-10-CM) - Disorder of muscle, unspecified  THERAPY DIAG:  Muscle weakness (generalized)  Unspecified lack of coordination  Abnormal  posture  Rationale for Evaluation and Treatment: Rehabilitation  ONSET DATE: one year  SUBJECTIVE:                                                                                                                                                                                           SUBJECTIVE STATEMENT: Pt reports that things have been a lot better. She is more aware of not going to the bathroom all the  time. Has not had any leakage. Felt good after last visit Has not done any jumping, that would be a good test.  Urge is not waking her up.  She is 75% improved, but does not know about the jumps.    Total liquid (black tea in morning, sparkling water, sometimes alcohol, sometimes Poppi's) usually total 48 oz PAIN:  Are you having pain? No   PRECAUTIONS: None  RED FLAGS: None   WEIGHT BEARING RESTRICTIONS: No  FALLS:  Has patient fallen in last 6 months? No  OCCUPATION: director of [employer]   ACTIVITY LEVEL : walking daily 20-30 mins but not high intensity; does weight lifting 3x weekly   PLOF: Independent  PATIENT GOALS: to have less urine symptoms   PERTINENT HISTORY:  DVT in Lt peroneal vein, does take HRT, HYSTEROSCOPY WITH D & C, Rhabdomyolysis,  Sexual abuse: No  BOWEL MOVEMENT: Pain with bowel movement: No Type of bowel movement:Type (Bristol Stool Scale) 7, Frequency daily, and Strain no Fully empty rectum: No Leakage: No Pads: No Fiber supplement/laxative Yes - Psyllium Husk   URINATION: Pain with urination: No Fully empty bladder: No - unsure Stream: Strong and Weak Urgency: No Frequency: usually 1-2x nightly; 1.5-2 hours daily  Leakage: Urge to void, Coughing, Sneezing, Laughing, Exercise, and Lifting Pads: No  INTERCOURSE:  Ability to have vaginal penetration Yes  Pain with intercourse: none DrynessYes  Climax: not painful  Marinoff Scale: 0/3  PREGNANCY: Vaginal deliveries 2 Tearing Yes: with first needed stitches and reports  traumatic, vacuum delivery   C-section deliveries 0 Currently pregnant No  PROLAPSE: None   OBJECTIVE:  Note: Objective measures were completed at Evaluation unless otherwise noted.  DIAGNOSTIC FINDINGS:   COGNITION: Overall cognitive status: Within functional limits for tasks assessed     SENSATION: Light touch: Appears intact   POSTURE: rounded shoulders   LUMBARAROM/PROM:  A/PROM A/PROM  eval  Flexion WFL  Extension WFL  Right lateral flexion WFL  Left lateral flexion WFL  Right rotation WFL  Left rotation WFL   (Blank rows = not tested)  LOWER EXTREMITY ROM:  Bil hips limited by 25% in ROM in bil hamstrings  LOWER EXTREMITY MMT:  Bil hip abduction 3+/5 all others 4/5 PALPATION:   General: tight bil lumbar paraspinals, glute and jaw clenching noted and pt reported  Pelvic Alignment: WFL  Abdominal: no TTP but does have tightness in Rt side of abdominal quadrants                 External [facility]al Exam: [facility]                             Internal Pelvic Floor: no TTP, tightness noted in deep layer of pelvic floor bil and superficial  Patient confirms identification and approves PT to assess internal pelvic floor and treatment Yes No emotional/communication barriers or cognitive limitation. Patient is motivated to learn. Patient understands and agrees with treatment goals and plan. PT explains patient will be examined in standing, sitting, and lying down to see how their muscles and joints work. When they are ready, they will be asked to remove their underwear so PT can examine their perineum. The patient is also given the option of providing their own chaperone as one is not provided in our facility. The patient also has the right and is explained the right to defer or refuse any part of the evaluation or treatment including  the internal exam. With the patient's consent, PT will use one gloved finger to gently assess the muscles of the pelvic floor, seeing how well  it contracts and relaxes and if there is muscle symmetry. After, the patient will get dressed and PT and patient will discuss exam findings and plan of care. PT and patient discuss plan of care, schedule, attendance policy and HEP activities.   PELVIC MMT:   MMT eval  Vaginal 3/5; 8s; 6 reps  Internal Anal Sphincter   External Anal Sphincter   Puborectalis   Diastasis Recti no  (Blank rows = not tested)        TONE: Slightly increased   PROLAPSE: Not seen in hooklying with cough   TODAY'S TREATMENT:                                                                                                                              DATE:   03/16/24 Ball press with pelvic floor lift to train knack with and without cough Dying bug with a ball 20 reps with transverse abdominis breath Urge suppression strategies reviewed Single leg squats to train jumps Hip adduction with ball with bridge 15 reps Sidelying clams with black loop 20 reps bilat Side stepping 1 lap with black loop Step ups 16 inch 5 reps                    02/24/24: Hooklying opp hand/knee ball press 2x10 2x10 bridges with exhale and pelvic floor contraction 2x10 sidelying ball press with hip abduction Seated blue band bil shoulder horizontal abduction with transverse abdominis activation, exhale, pelvic floor activation 2x10 2x10 10# Squats with pelvic floor contraction and exhale Farmer's carry 10#>15# 750' each hand Standing marching with 10# iso at chest height 75% extended 2x10 Pt educated on urge drill and hand out given   03/02/24: Reviewed bladder retraining techniques and urge drill in case specific ways Hooklying transverse abdominis + pelvic floor contraction and exhale 2x10 Hooklying transverse abdominis with bil shoulder horizontal abduction +pelvic floor contraction and exhale 2x10 Hooklying hip abduction blue band 2x10 +pelvic floor contraction and exhale 2x10 sidelying ball press with hip abduction 10#  iso chest height (50% out), marching 2x10 Farmer's carry 15# 1000' each hand 3# standing hip abduction, flexion, ext 2x10 15# dead lifts 2x10  03/28/24: Hooklying black loop 2x10 abduction +pelvic floor contraction Bird dogs 2x10 Fire hydrants 2x10 each + pelvic floor contraction Standing bil shoulder horizontal abduction green band 2x10  15# same side single leg dead lift x10 each Farmers carry 20# 1000' each hand Open books x10 each side Hamstring stretch 2x30s each    PATIENT EDUCATION:  Education details: [id number], bladder irritants  Person educated: Patient Education method: Explanation, Demonstration, Tactile cues, Verbal cues, and Handouts Education comprehension: verbalized understanding, returned demonstration, verbal cues required, tactile cues required, and needs further education  HOME EXERCISE PROGRAM: [id number]  ASSESSMENT:  CLINICAL IMPRESSION: Pt progressing  well towards goals, she reported 75% improvement. Pt with a little difficulty with coordination with breath, needed VC's. Stated she is not sure she would leak with jumping, will try at home and report.  Pt will continue to benefit from PT to address deficits   OBJECTIVE IMPAIRMENTS: decreased activity tolerance, decreased coordination, decreased endurance, decreased mobility, decreased strength, increased fascial restrictions, increased muscle spasms, impaired flexibility, improper body mechanics, and postural dysfunction.   ACTIVITY LIMITATIONS: carrying, lifting, squatting, continence, and locomotion level  PARTICIPATION LIMITATIONS: community activity  PERSONAL FACTORS: Time since onset of injury/illness/exacerbation and 1 comorbidity: medical history are also affecting patient's functional outcome.   REHAB POTENTIAL: Good  CLINICAL DECISION MAKING: Stable/uncomplicated  EVALUATION COMPLEXITY: Low   GOALS: Goals reviewed with patient? Yes  SHORT TERM GOALS: Target date: 01/15/24  Pt to be I  with HEP.  Baseline: Goal status: MET  2.  Pt to be I with relaxation techniques to decreased stress and gripping at pelvic floor.  Baseline:  Goal status: MET  3.  Pt to be I with abdominal massage and voiding mechanics for improved bowel type and habits.  Baseline:  Goal status: on going  4.  Pt to be I with knack for decreased urinary incontinence with stressors.  Baseline:  Goal status: MET  LONG TERM GOALS: Target date: 06/19/24  Pt to be I with advanced HEP.  Baseline:  Goal status: on going  2.  Pt to report improved time between bladder voids to at least 3 hours for improved QOL with decreased urinary frequency.   Baseline:  Goal status: MET  3.  Pt to demonstrate improved coordination of pelvic floor and breathing mechanics with 20# squat with appropriate synergistic patterns to decrease pain and leakage at least 75% of the time.    Baseline:  Goal status: on going  4.  Pt report at least stool type 5 with 75% of bowel movement for improved stool stype on Bristol scale and decreased fecal urgency.  Baseline:  Goal status: on going  5.  Pt to demonstrate at least 5/5 bil hip strength for improved pelvic stability and functional squats without leakage.  Baseline:  Goal status: on going   6.  Pt to report no more than 1 urinary incontinence instance in a week for improved confidence with working out Baseline:  Goal status: on going   PLAN:  PT FREQUENCY: 1x/week  PT DURATION: 8 sessions  PLANNED INTERVENTIONS: 97110-Therapeutic exercises, 97530- Therapeutic activity, 97112- Neuromuscular re-education, 97535- Self Care, [id number]- Manual therapy, Patient/Family education, Taping, Dry Needling, Joint mobilization, Spinal mobilization, Scar mobilization, DME instructions, Cryotherapy, Moist heat, and Biofeedback  PLAN FOR NEXT SESSION: core and hip strengthening with pelvic floor/breathing coordination, knack, urge drill    Damarrion Mimbs, PT 03/16/24 12:21 PM

## 2024-03-22 ENCOUNTER — Ambulatory Visit (HOSPITAL_COMMUNITY)
Admission: RE | Admit: 2024-03-22 | Discharge: 2024-03-22 | Disposition: A | Discharge status: Home / Self Care | Source: Ambulatory Visit | Attending: Hematology and Oncology | Admitting: Hematology and Oncology

## 2024-03-22 DIAGNOSIS — I82452 Acute embolism and thrombosis of left peroneal vein: Secondary | ICD-10-CM | POA: Diagnosis not present

## 2024-03-23 ENCOUNTER — Ambulatory Visit: Payer: Self-pay | Admitting: Hematology and Oncology

## 2024-03-23 ENCOUNTER — Encounter (HOSPITAL_COMMUNITY)

## 2024-03-29 ENCOUNTER — Telehealth: Payer: Self-pay | Admitting: Hematology and Oncology

## 2024-03-29 NOTE — Telephone Encounter (Signed)
 Spoke with patient confirming upcoming appointment

## 2024-04-21 ENCOUNTER — Telehealth: Payer: Self-pay

## 2024-04-21 NOTE — Telephone Encounter (Signed)
 Left message to confirm appt for 7/31

## 2024-04-22 ENCOUNTER — Inpatient Hospital Stay: Attending: Hematology and Oncology | Admitting: Hematology and Oncology

## 2024-04-22 VITALS — BP 135/82 | HR 68 | Temp 98.8°F | Resp 17 | Wt 181.8 lb

## 2024-04-22 DIAGNOSIS — D6851 Activated protein C resistance: Secondary | ICD-10-CM | POA: Diagnosis not present

## 2024-04-22 DIAGNOSIS — Z7901 Long term (current) use of anticoagulants: Secondary | ICD-10-CM | POA: Insufficient documentation

## 2024-04-22 DIAGNOSIS — I82452 Acute embolism and thrombosis of left peroneal vein: Secondary | ICD-10-CM | POA: Diagnosis present

## 2024-04-22 DIAGNOSIS — Z806 Family history of leukemia: Secondary | ICD-10-CM | POA: Insufficient documentation

## 2024-04-22 MED ORDER — APIXABAN 2.5 MG PO TABS: 2.5000 mg | ORAL_TABLET | Freq: Two times a day (BID) | ORAL | 5 refills | Status: AC

## 2024-04-22 NOTE — Assessment & Plan Note (Signed)
 Distal Deep Vein Thrombosis (DVT) She experienced a distal DVT in the peroneal vein provoked by hormone replacement therapy (HRT) in the context of heterozygous Factor V Leiden mutation.  Recent US  showed complete resolution of clot. She has a heterozygous Factor V Leiden mutation, which predisposes her to an increased risk of thrombosis. This genetic factor, combined with HRT, likely contributed to the development of the recent DVT. Since she was unable to wean off HRT, we discussed about continuing prophylactic dose of anticoagulation. Eliquis  2.5 mg PO BID has been dispensed to the pharmacy of her choice. Rest of the hypercoag work up deferred at this time. RTC as needed.

## 2024-04-22 NOTE — Progress Notes (Signed)
  Cancer Center CONSULT NOTE  Patient Care Team: Claudene Pellet, MD as PCP - General (Family Medicine)  CHIEF COMPLAINTS/PURPOSE OF CONSULTATION:  LLE DVT  ASSESSMENT & PLAN:   Acute deep vein thrombosis (DVT) of left peroneal vein (HCC) Distal Deep Vein Thrombosis (DVT) She experienced a distal DVT in the peroneal vein provoked by hormone replacement therapy (HRT) in the context of heterozygous Factor V Leiden mutation.  Recent US  showed complete resolution of clot. She has a heterozygous Factor V Leiden mutation, which predisposes her to an increased risk of thrombosis. This genetic factor, combined with HRT, likely contributed to the development of the recent DVT. Since she was unable to wean off HRT, we discussed about continuing prophylactic dose of anticoagulation. Eliquis  2.5 mg PO BID has been dispensed to the pharmacy of her choice. Rest of the hypercoag work up deferred at this time. RTC as needed.    HISTORY OF PRESENTING ILLNESS:  Carol Rodriguez 53 y.o. female is here because of LLE DVT  Carol Rodriguez is a 53 year old female with a history of heterozygous Factor V Leiden mutation who presents with a recent deep vein thrombosis (DVT).  Discussed the use of AI scribe software for clinical note transcription with the patient, who gave verbal consent to proceed.  History of Present Illness  Carol Rodriguez is here for follow up. Since her last visit here, she has weaned herself off testosterone replacement therapy. She couldn't tolerate when she tried to wean herself off estrogen. She has terrible hot flashes, couldn't sleep and felt poorly. She had a recent US  which showed resolution of clot. She has no new chest pain or SOB. She wishes to pursue prophylactic dose of anticoagulation while she is on HRT.  All other systems were reviewed with the patient and are negative.  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    see lov  10-10-2017 dr floria monte md note in epic, no treatment needed   PMB (postmenopausal bleeding)    Rhabdomyolysis 06/21/2013   arms swelled and acute kidney injury issue resolved   Sleep apnea    mild osa, using oral appliance for teeth grinding    SURGICAL HISTORY: Past Surgical History:  Procedure Laterality Date   colonscopy     2021 or 2022   HYSTEROSCOPY WITH D & C N/A 04/08/2022   Procedure: DILATATION AND CURETTAGE /HYSTEROSCOPY;  Surgeon: Rendell Calton LABOR, DO;  [facility];  Service: Gynecology;  Laterality: N/A;    SOCIAL HISTORY: Social History   Socioeconomic History   Marital status: Married    Spouse name: Not on file   Number of children: Not on file   Years of education: Not on file   Highest education level: Not on file  Occupational History   Not on file  Tobacco Use   Smoking status: Never   Smokeless tobacco: Never  Vaping Use   Vaping status: Never Used  Substance and Sexual Activity   Alcohol use: Yes    Comment: social   Drug use: No   Sexual activity: Yes  Other Topics Concern   Not on file  Social History  Narrative   Not on file   Social Drivers of Health   Financial Resource Strain: Not on file  Food Insecurity: Not on file  Transportation Needs: Not on file  Physical Activity: Not on file  Stress: Not on file  Social Connections: Not on file  Intimate Partner Violence: Not on file    FAMILY HISTORY: Family History  Problem Relation Age of Onset   Prostate cancer Father 65       'high gleason score' metastatic   Prostate cancer Paternal Uncle        radiation treatment   Breast cancer Maternal Grandmother 65   Heart attack Maternal Grandfather 58   Other Paternal Grandmother 22        had a 'cancerous colon polyp' very shortly before death   Heart attack Paternal Grandfather    Other Paternal Grandfather        might have had a prostate issue   Leukemia Cousin     ALLERGIES:  is allergic to coffea arabica; egg solids, whole; and gluten meal.  MEDICATIONS:  Current Outpatient Medications  Medication Sig Dispense Refill   apixaban  (ELIQUIS ) 2.5 MG TABS tablet Take 1 tablet (2.5 mg total) by mouth 2 (two) times daily. 60 tablet 5   ALPRAZolam (XANAX) 0.5 MG tablet Take 0.5 mg by mouth at bedtime as needed for anxiety.     cetirizine (ZYRTEC) 10 MG tablet Take 10 mg by mouth daily.     estradiol (ESTRACE) 0.1 MG/GM vaginal cream Place 1 Applicatorful vaginally 2 (two) times a week.     estradiol (VIVELLE-DOT) 0.075 MG/24HR Place 1 patch onto the skin 2 (two) times a week.     fluticasone (FLONASE ALLERGY RELIEF) 50 MCG/ACT nasal spray Place into both nostrils as needed for allergies or rhinitis.     MAGNESIUM GLYCINATE PO Take by mouth.     Multiple Vitamins-Minerals (MULTIVITAMIN WITH MINERALS) tablet Take 1 tablet by mouth daily.     NONFORMULARY OR COMPOUNDED ITEM Testosterone 2% cream     Omega-3 Fatty Acids (FISH OIL PO) Take by mouth daily.     OVER THE COUNTER MEDICATION Vitamin d / vitamin k 2 combination 1 tab daily     PRESCRIPTION MEDICATION Estrogen patch 0.75 changes patch 2 x week     Probiotic Product (PROBIOTIC DAILY PO) Take by mouth.     progesterone (PROMETRIUM) 100 MG capsule Take 300 mg by mouth at bedtime.     UNABLE TO FIND Cbd/melatonin  5 mg gummy prn qhs     zolpidem  (AMBIEN ) 10 MG tablet Take 10 mg by mouth at bedtime as needed for sleep.     No current facility-administered medications for this visit.     PHYSICAL EXAMINATION: ECOG PERFORMANCE STATUS: 0 - Asymptomatic  Vitals:   04/22/24 1303  BP: 135/82  Pulse: 68  Resp: 17  Temp: 98.8 F (37.1 C)  SpO2: 100%   Filed Weights   04/22/24 1303  Weight: 181 lb 12.8 oz (82.5 kg)     GENERAL:alert, no distress and comfortable SKIN: skin color, texture, turgor are normal, no rashes or significant lesions EYES: normal, conjunctiva are pink and non-injected, sclera clear OROPHARYNX:no exudate, no erythema and lips, buccal mucosa, and tongue normal  NECK: supple, thyroid normal size, non-tender, without nodularity LYMPH:  no palpable lymphadenopathy in the cervical, axillary  LUNGS: clear to auscultation and percussion with normal breathing effort HEART: regular rate & rhythm and no murmurs and no lower extremity edema  ABDOMEN:abdomen soft, non-tender and normal bowel sounds Musculoskeletal:no cyanosis of digits and no clubbing  PSYCH: alert & oriented x 3 with fluent speech NEURO: no focal motor/sensory deficits  LABORATORY DATA:  I have reviewed the data as listed Lab Results  Component Value Date   WBC 5.8 05/26/2022   HGB 13.3 05/26/2022   HCT 37.9 05/26/2022   MCV 94.3 05/26/2022   PLT 181 05/26/2022     Chemistry      Component Value Date/Time   NA 138 05/26/2022 0433   K 3.8 05/26/2022 0433   CL 106 05/26/2022 0433   CO2 25 05/26/2022 0433   BUN 24 (H) 05/26/2022 0433   CREATININE 1.14 (H) 05/26/2022 0433      Component Value Date/Time   CALCIUM 8.5 (L) 05/26/2022 0433   ALKPHOS 35 (L) 05/26/2022 0433   AST 20 05/26/2022 0433   ALT 15 05/26/2022 0433   BILITOT 0.6 05/26/2022 0433       RADIOGRAPHIC STUDIES: I have personally reviewed the radiological images as listed and agreed with the findings in the report. No results found.   All questions were answered. The patient knows to call the clinic with any problems, questions or concerns. I spent 30 minutes in the care of this patient including H and P, review of records, counseling and coordination of care.     Amber Stalls, MD 04/22/2024 2:15 PM

## 2025-04-22 ENCOUNTER — Ambulatory Visit: Admitting: Hematology and Oncology
